# Patient Record
Sex: Male | Born: 1955 | Race: White | Hispanic: No | Marital: Married | Smoking: Former smoker
Health system: Southern US, Community
[De-identification: ages and names within clinical notes are randomized; demographics above are authoritative.]

## PROBLEM LIST (undated history)

## (undated) DIAGNOSIS — E669 Obesity, unspecified: Secondary | ICD-10-CM

## (undated) DIAGNOSIS — I10 Essential (primary) hypertension: Secondary | ICD-10-CM

## (undated) DIAGNOSIS — J45909 Unspecified asthma, uncomplicated: Secondary | ICD-10-CM

## (undated) DIAGNOSIS — M19079 Primary osteoarthritis, unspecified ankle and foot: Secondary | ICD-10-CM

## (undated) DIAGNOSIS — E559 Vitamin D deficiency, unspecified: Secondary | ICD-10-CM

## (undated) DIAGNOSIS — U071 COVID-19: Secondary | ICD-10-CM

## (undated) DIAGNOSIS — R000 Tachycardia, unspecified: Secondary | ICD-10-CM

## (undated) DIAGNOSIS — K279 Peptic ulcer, site unspecified, unspecified as acute or chronic, without hemorrhage or perforation: Secondary | ICD-10-CM

## (undated) DIAGNOSIS — M109 Gout, unspecified: Secondary | ICD-10-CM

## (undated) HISTORY — DX: Peptic ulcer, site unspecified, unspecified as acute or chronic, without hemorrhage or perforation: K27.9

## (undated) HISTORY — DX: Essential (primary) hypertension: I10

## (undated) HISTORY — DX: Obesity, unspecified: E66.9

## (undated) HISTORY — DX: Primary osteoarthritis, unspecified ankle and foot: M19.079

## (undated) HISTORY — DX: Vitamin D deficiency, unspecified: E55.9

## (undated) HISTORY — DX: Gout, unspecified: M10.9

---

## 2005-10-25 ENCOUNTER — Ambulatory Visit: Payer: Self-pay | Admitting: General Surgery

## 2008-07-04 HISTORY — PX: OTHER SURGICAL HISTORY: SHX169

## 2008-07-21 ENCOUNTER — Inpatient Hospital Stay: Payer: Self-pay | Admitting: Internal Medicine

## 2008-07-22 ENCOUNTER — Ambulatory Visit: Payer: Self-pay | Admitting: Cardiology

## 2008-07-22 LAB — HM COLONOSCOPY

## 2011-12-03 ENCOUNTER — Ambulatory Visit: Payer: Self-pay | Admitting: Family Medicine

## 2011-12-04 ENCOUNTER — Ambulatory Visit: Payer: Self-pay | Admitting: Family Medicine

## 2011-12-04 LAB — CBC WITH DIFFERENTIAL/PLATELET
Basophil #: 0 10*3/uL (ref 0.0–0.1)
Basophil %: 0.5 %
Eosinophil #: 0.1 10*3/uL (ref 0.0–0.7)
HCT: 44.2 % (ref 40.0–52.0)
HGB: 15.3 g/dL (ref 13.0–18.0)
Lymphocyte %: 32.8 %
MCHC: 34.5 g/dL (ref 32.0–36.0)
Monocyte %: 9.9 %
Neutrophil %: 55.7 %
RBC: 5 10*6/uL (ref 4.40–5.90)
RDW: 13.3 % (ref 11.5–14.5)
WBC: 7.1 10*3/uL (ref 3.8–10.6)

## 2012-11-13 ENCOUNTER — Ambulatory Visit: Payer: Self-pay

## 2013-06-26 IMAGING — CR DG FOOT COMPLETE 3+V*L*
1 series · 3 of 3 positions shown · non-contrast
Comparison: none

REASON FOR EXAM: swelling gout
COMMENTS:

[Series 1: x foot ap left · 0.14mm/px · 3 of 3 slices shown]
[im 1/3]
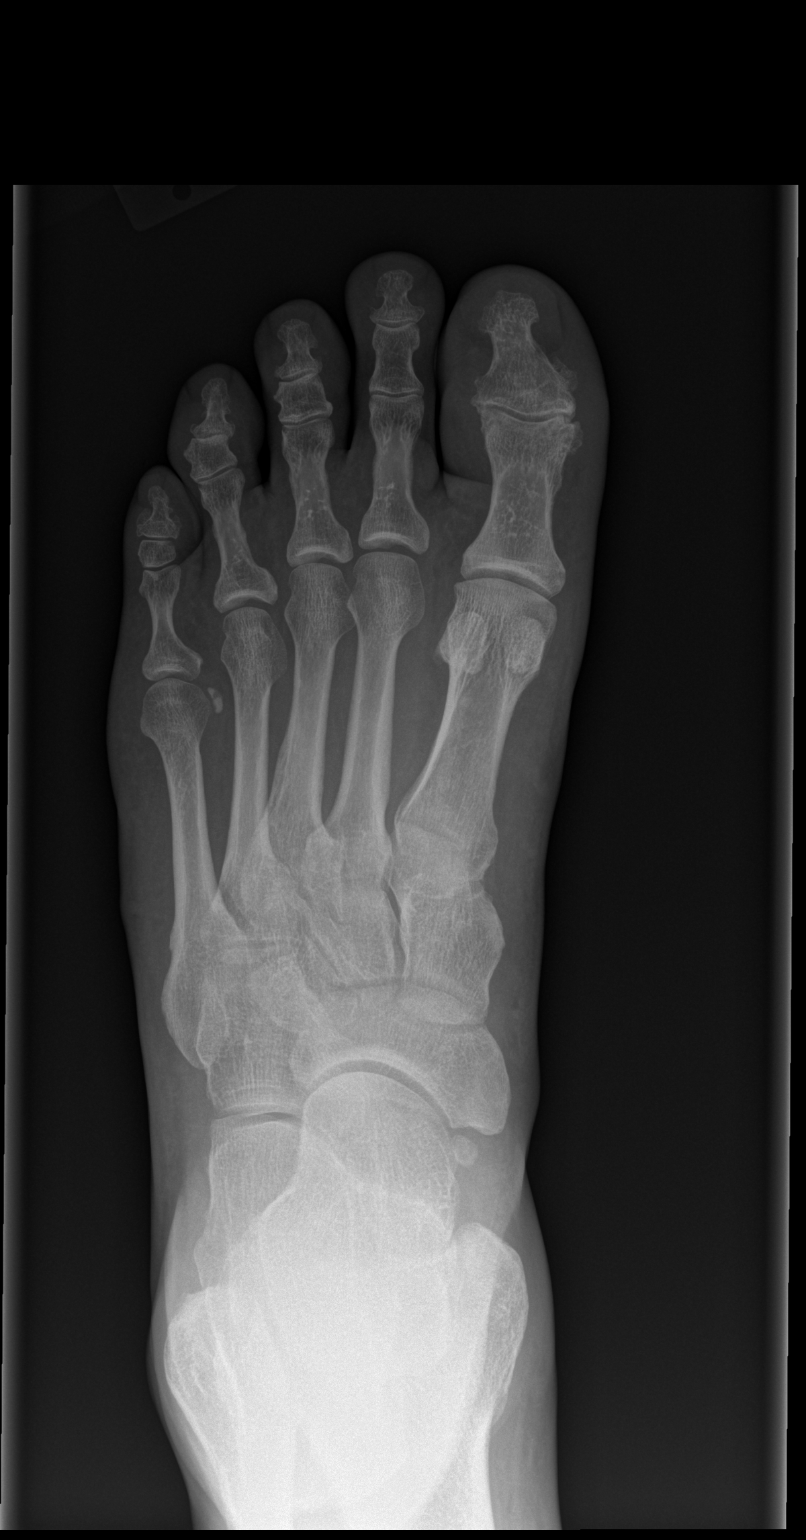
[im 2/3]
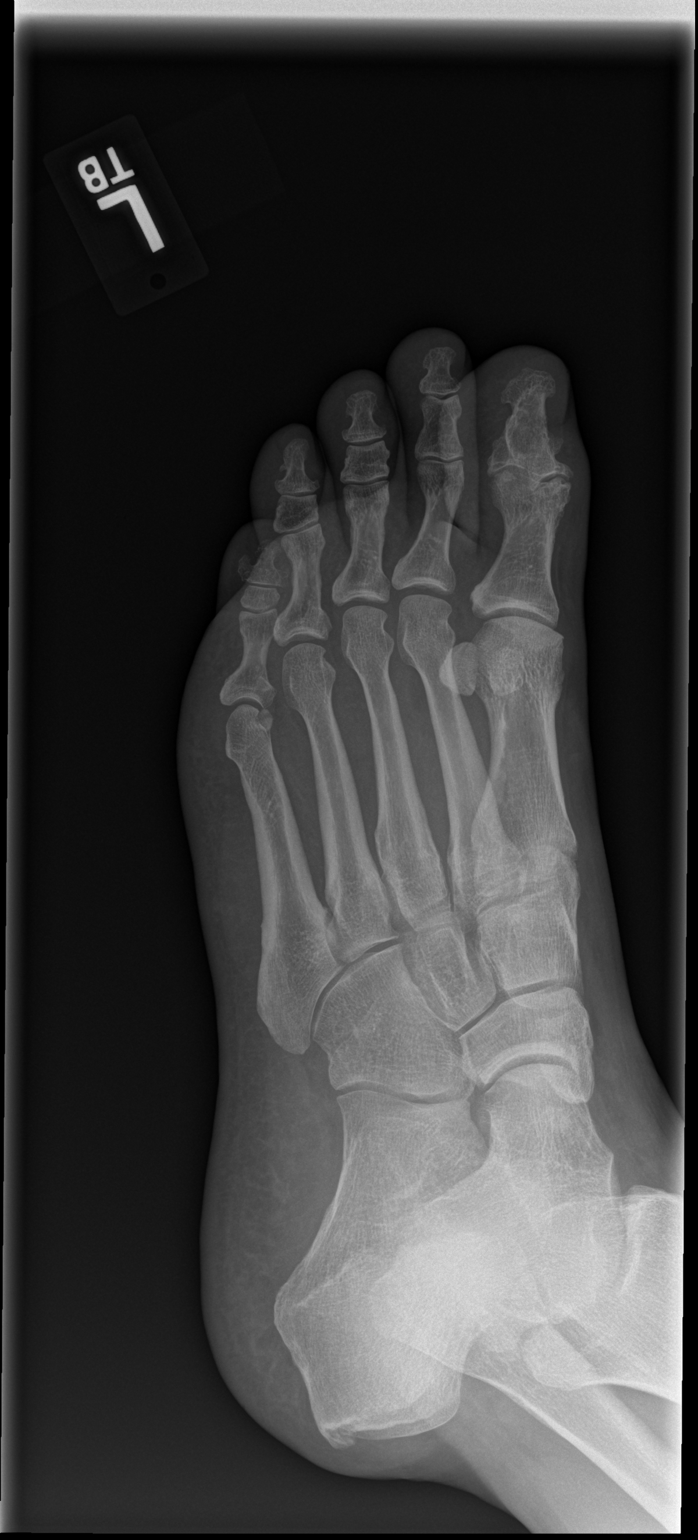
[im 3/3]
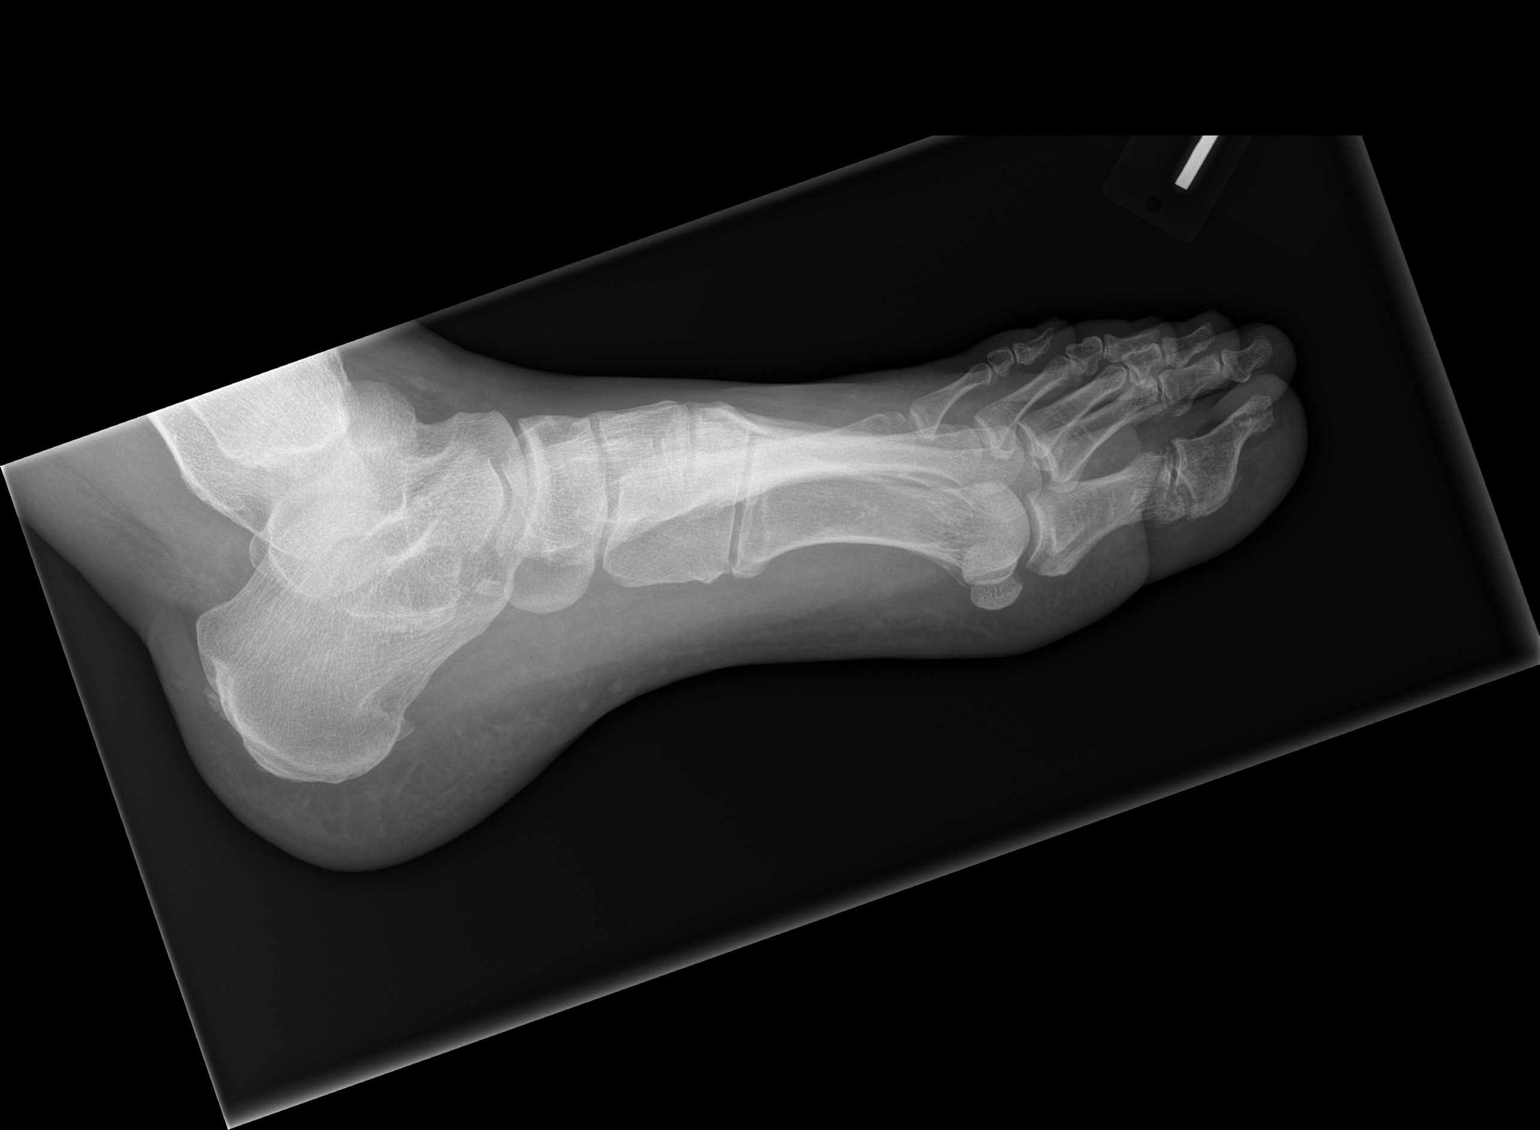

[3 of 3 positions shown; findings below may reference images not displayed]

PROCEDURE:     DXR - DXR FOOT LT COMP W/OBLIQUES  - December 04, 2011  [DATE]

RESULT:     Three views of the left foot are submitted. The bones are
reasonably well mineralized. Mild narrowing of the interphalangeal joints is
present consistent with osteoarthritis. I do not see erosive changes. There
is no juxtarticular osteopenia. No abnormal soft tissue calcifications are
identified.
IMPRESSION: 1. There are no findings to suggest gouty arthritis.
2. There are findings which likely reflect osteoarthritis involving the
interphalangeal joints.
3. Not mentioned above is a small plantar calcaneal spur.

## 2014-06-06 IMAGING — CR DG LUMBAR SPINE 2-3V
1 series · 4 of 4 positions shown · non-contrast
Comparison: none

REASON FOR EXAM: low back pain
COMMENTS:

PROCEDURE:     KDR - KDXR LUMBAR SPINE AP AND LATERAL  - November 13, 2012  [DATE]
RESULT:     History: Low back pain.
Comparison Study: No prior.

[Series 1: ap · 0.17mm/px · 4 of 4 slices shown]
[im 1/4]
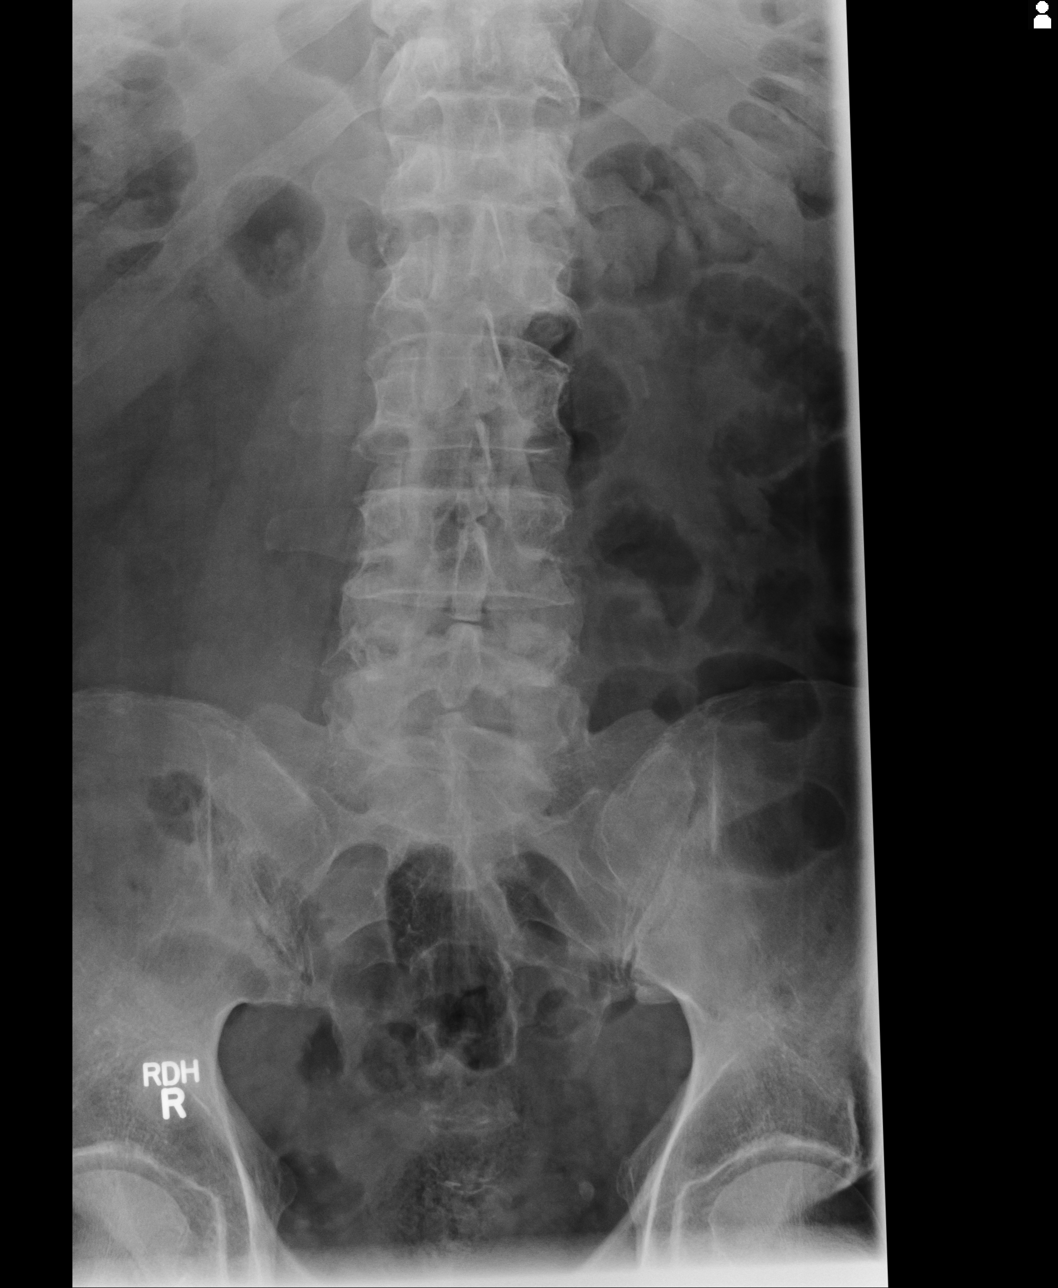
[im 2/4]
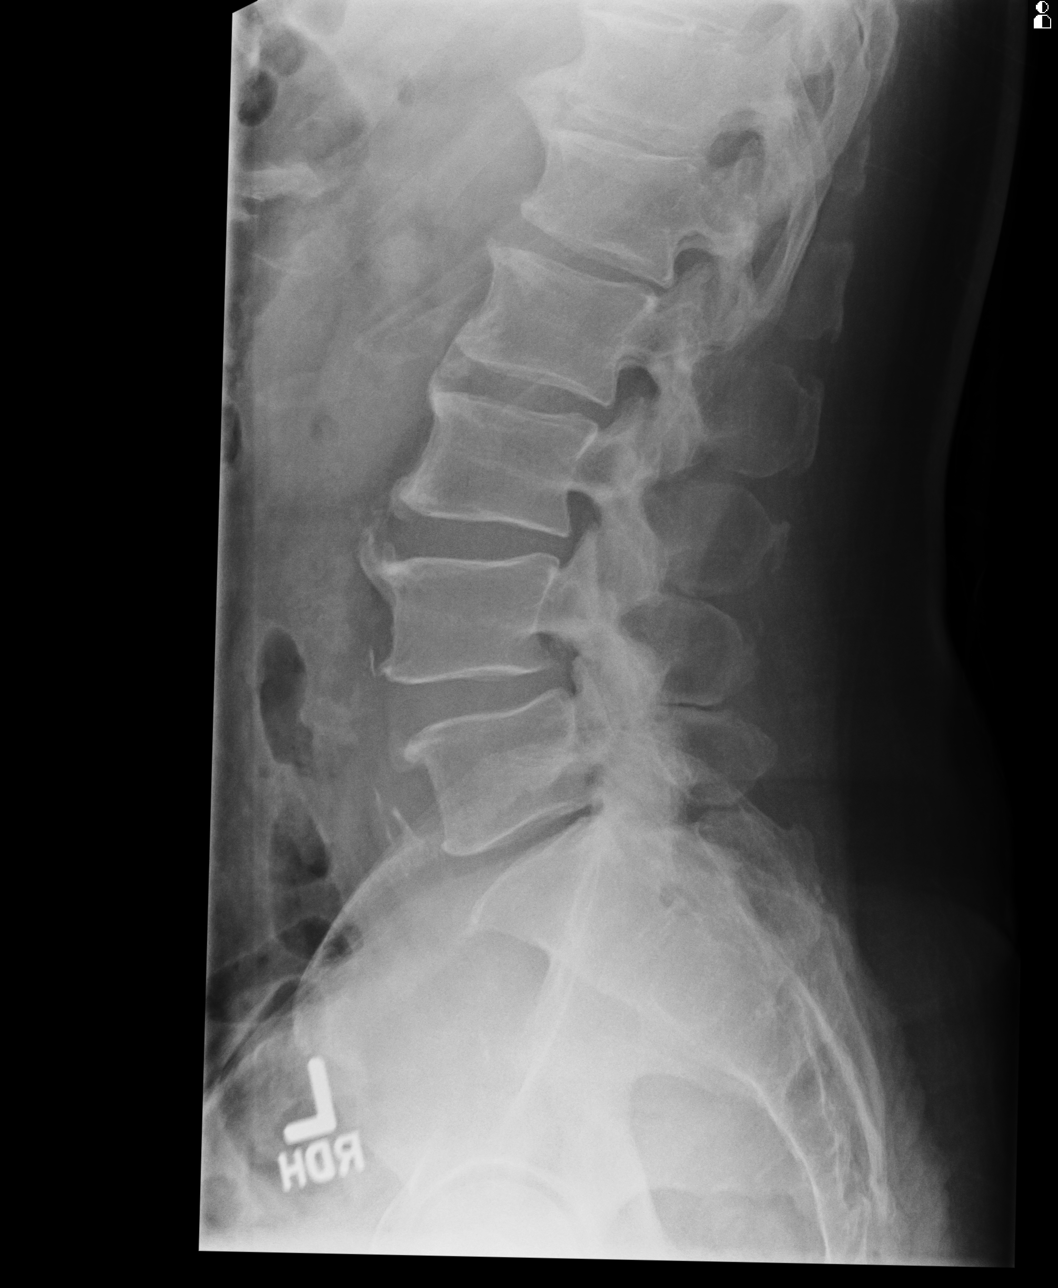
[im 3/4]
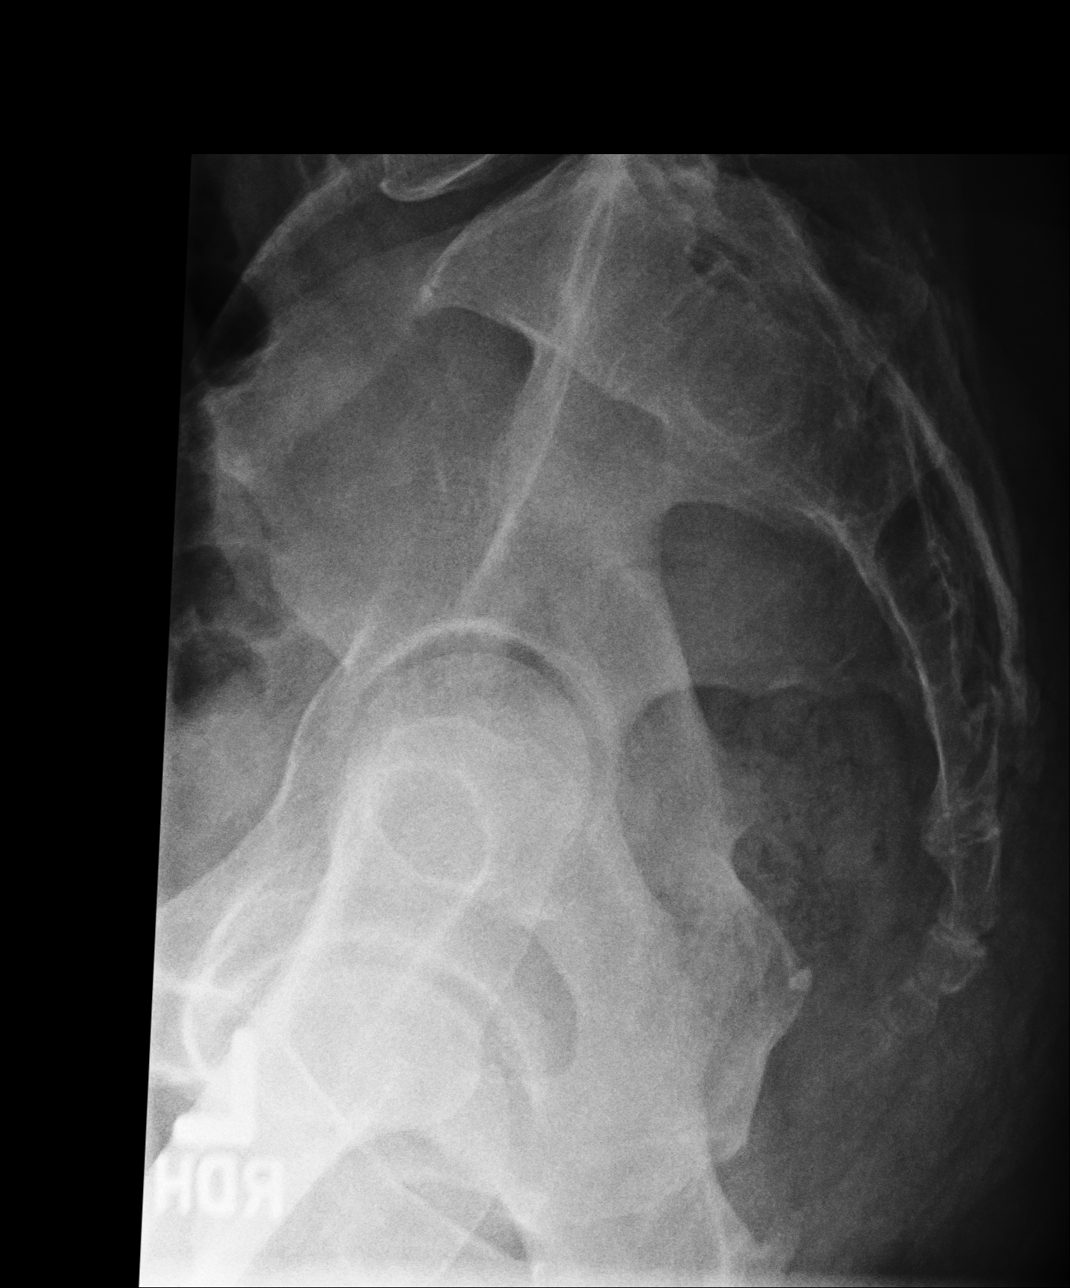
[im 4/4]
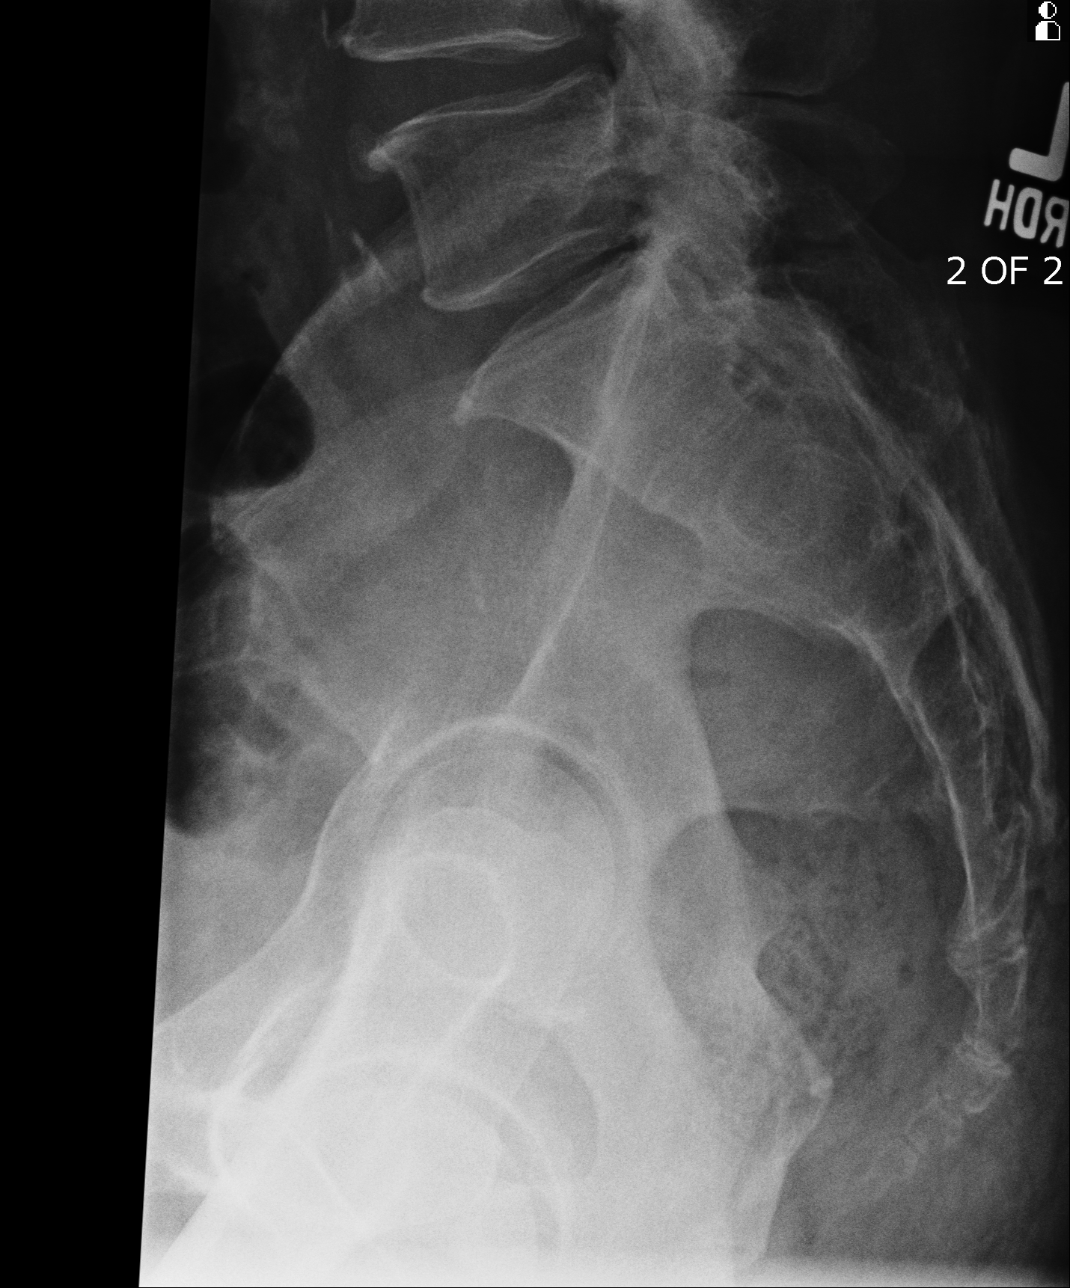

[4 of 4 positions shown; findings below may reference images not displayed]

FINDINGS: Diffuse degenerative change with prominent endplate osteophyte
formation and facet hypertrophy. No acute bony abnormality identified.
Vascular calcification noted. Degenerative changes noted SI joints and both
hips.
IMPRESSION: Diffuse degenerative change. No acute abnormality.

## 2014-07-14 LAB — LIPID PANEL
CHOLESTEROL: 179 mg/dL (ref 0–200)
HDL: 39 mg/dL (ref 35–70)
LDL Cholesterol: 103 mg/dL
LDl/HDL Ratio: 2.6
TRIGLYCERIDES: 185 mg/dL — AB (ref 40–160)

## 2014-07-14 LAB — BASIC METABOLIC PANEL
BUN: 10 mg/dL (ref 4–21)
CREATININE: 0.9 mg/dL (ref <=1.3)
GLUCOSE: 94 mg/dL
Potassium: 4.3 mmol/L (ref 3.4–5.3)
Sodium: 139 mmol/L (ref 137–147)

## 2014-07-14 LAB — TSH: TSH: 2.58 u[IU]/mL (ref <=5.90)

## 2014-07-14 LAB — HEPATIC FUNCTION PANEL
ALT: 22 U/L (ref 10–40)
AST: 15 U/L (ref 14–40)
Alkaline Phosphatase: 75 U/L (ref 25–125)
Bilirubin, Total: 0.6 mg/dL

## 2014-07-14 LAB — CBC AND DIFFERENTIAL
HCT: 46 % (ref 41–53)
Hemoglobin: 16.1 g/dL (ref 13.5–17.5)
Neutrophils Absolute: 4 /uL
Platelets: 287 10*3/uL (ref 150–399)
WBC: 6.5 10*3/mL

## 2014-07-14 LAB — PSA: PSA: 3.2

## 2014-12-06 DIAGNOSIS — I10 Essential (primary) hypertension: Secondary | ICD-10-CM

## 2014-12-06 DIAGNOSIS — M19079 Primary osteoarthritis, unspecified ankle and foot: Secondary | ICD-10-CM

## 2014-12-06 DIAGNOSIS — E7800 Pure hypercholesterolemia, unspecified: Secondary | ICD-10-CM | POA: Insufficient documentation

## 2014-12-06 DIAGNOSIS — M109 Gout, unspecified: Secondary | ICD-10-CM | POA: Insufficient documentation

## 2014-12-06 DIAGNOSIS — E559 Vitamin D deficiency, unspecified: Secondary | ICD-10-CM | POA: Insufficient documentation

## 2014-12-06 DIAGNOSIS — R5381 Other malaise: Secondary | ICD-10-CM | POA: Insufficient documentation

## 2014-12-06 DIAGNOSIS — R5383 Other fatigue: Secondary | ICD-10-CM

## 2014-12-06 DIAGNOSIS — E669 Obesity, unspecified: Secondary | ICD-10-CM | POA: Insufficient documentation

## 2014-12-06 DIAGNOSIS — G4700 Insomnia, unspecified: Secondary | ICD-10-CM | POA: Insufficient documentation

## 2014-12-06 DIAGNOSIS — K279 Peptic ulcer, site unspecified, unspecified as acute or chronic, without hemorrhage or perforation: Secondary | ICD-10-CM | POA: Insufficient documentation

## 2014-12-06 DIAGNOSIS — M5440 Lumbago with sciatica, unspecified side: Secondary | ICD-10-CM | POA: Insufficient documentation

## 2014-12-06 DIAGNOSIS — R002 Palpitations: Secondary | ICD-10-CM | POA: Insufficient documentation

## 2014-12-06 HISTORY — DX: Peptic ulcer, site unspecified, unspecified as acute or chronic, without hemorrhage or perforation: K27.9

## 2014-12-06 HISTORY — DX: Vitamin D deficiency, unspecified: E55.9

## 2014-12-06 HISTORY — DX: Primary osteoarthritis, unspecified ankle and foot: M19.079

## 2014-12-06 HISTORY — DX: Essential (primary) hypertension: I10

## 2014-12-06 HISTORY — DX: Obesity, unspecified: E66.9

## 2014-12-15 ENCOUNTER — Encounter: Payer: Self-pay | Admitting: Family Medicine

## 2014-12-15 ENCOUNTER — Ambulatory Visit (INDEPENDENT_AMBULATORY_CARE_PROVIDER_SITE_OTHER): Payer: BLUE CROSS/BLUE SHIELD | Admitting: Family Medicine

## 2014-12-15 VITALS — BP 120/72 | HR 74 | Temp 98.1°F | Resp 16 | Wt 215.0 lb

## 2014-12-15 DIAGNOSIS — I10 Essential (primary) hypertension: Secondary | ICD-10-CM | POA: Diagnosis not present

## 2014-12-15 NOTE — Progress Notes (Signed)
Patient ID: Eduardo Perez, male   DOB: 09/16/1955, 59 y.o.   MRN: [id number]    Subjective:  HPI  Hypertension, follow-up:  BP Readings from Last 3 Encounters:  12/15/14 120/72  08/09/14 142/74    He was last seen for hypertension 4 months ago.  BP at that visit was 142/74. Management changes since that visit include none, pt wanted to wait on initially, then called back later stated BP was running higher and Carvedilol was started. Marland Kitchen. He reports good compliance with treatment. He is not having side effects. He does report that he hit his hand on something and he bruised easily but has not had any since he first started it.   He is exercising. He is adherent to low salt diet.   Outside blood pressures are 120's-130's/80's. He is experiencing none.     Weight trend: stable Wt Readings from Last 3 Encounters:  12/15/14 215 lb (97.523 kg)  08/09/14 217 lb (98.431 kg)   ------------------------------------------------------------------------     Prior to Admission medications   Medication Sig Start Date End Date Taking? Authorizing Provider  carvedilol (COREG) 6.25 MG tablet Take by mouth. 10/20/14  Yes Historical Provider, MD    Patient Active Problem List   Diagnosis Date Noted  . Essential (primary) hypertension 12/06/2014  . Gout 12/06/2014  . Hypercholesteremia 12/06/2014  . Cannot sleep 12/06/2014  . Low back pain with sciatica 12/06/2014  . Malaise and fatigue 12/06/2014  . Adiposity 12/06/2014  . 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 Palpitations 12/06/2014  . Peptic ulcer 12/06/2014  . Avitaminosis D 12/06/2014    Past Medical History  Diagnosis Date  . Hypertension   . Gout     History   Social History  . Marital Status: Married    Spouse Name: N/A  . Number of Children: N/A  . Years of Education: N/A   Occupational History  . Not on file.   Social History Main Topics  . Smoking status: Former Games developermoker  . Smokeless tobacco: Not on file     Comment: former smoker; smoked for about 20 years; quit July 1996  . Alcohol Use: Yes     Comment: Occasional alcohol; usually beer, sometimes wine  . Drug Use: No  . Sexual Activity: Not on file   Other Topics Concern  . Not on file   Social History Narrative    Allergies  Allergen Reactions  . Losartan     palpitation    Review of Systems  Gastrointestinal: Negative.   Psychiatric/Behavioral: Negative.   All other systems reviewed and are negative.    There is no immunization history on file for this patient. Objective:  BP 120/72 mmHg  Pulse 74  Temp(Src) 98.1 F (36.7 C)  Resp 16  Wt 215 lb (97.523 kg)  Physical Exam  Constitutional: He is oriented to person, place, and time and well-developed, well-nourished, and in no distress.  HENT:  Head: Normocephalic and atraumatic.  Right Ear: External ear normal.  Left Ear: External ear normal.  Nose: Nose normal.  Eyes: Conjunctivae are normal.  Neck: Normal range of motion.  Cardiovascular: Normal rate, regular rhythm and normal heart sounds.   Pulmonary/Chest: Effort normal and breath sounds normal.  Abdominal: Soft. Bowel sounds are normal.  Neurological: He is oriented to person, place, and time.  Skin: Skin is warm and dry.  Psychiatric: Mood, memory, affect and judgment normal.    Lab Results  Component Value Date   WBC 6.5  07/14/2014   HGB 16.1 07/14/2014   HCT 46 07/14/2014   PLT 287 07/14/2014   CHOL 179 07/14/2014   TRIG 185* 07/14/2014   HDL 39 07/14/2014   LDLCALC 103 07/14/2014   TSH 2.58 07/14/2014   PSA 3.2 07/14/2014    CMP     Component Value Date/Time   NA 139 07/14/2014   K 4.3 07/14/2014   BUN 10 07/14/2014   CREATININE 0.9 07/14/2014   AST 15 07/14/2014   ALT 22 07/14/2014   ALKPHOS 75 07/14/2014    Assessment and Plan :  Hypertension Well-controlled. 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 I congratulated him on this. 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 Return to clinic 6 months. I have done the exam and reviewed the above chart and it is accurate to the best of my knowledge.  Julieanne Manson MD [facility] 12/15/2014 8:10 AM

## 2015-01-05 ENCOUNTER — Ambulatory Visit: Payer: Self-pay | Admitting: Family Medicine

## 2015-05-04 ENCOUNTER — Ambulatory Visit (INDEPENDENT_AMBULATORY_CARE_PROVIDER_SITE_OTHER): Payer: BLUE CROSS/BLUE SHIELD | Admitting: Family Medicine

## 2015-05-04 ENCOUNTER — Encounter: Payer: Self-pay | Admitting: Family Medicine

## 2015-05-04 VITALS — BP 146/72 | HR 84 | Temp 98.4°F | Resp 16 | Ht 71.25 in | Wt 213.0 lb

## 2015-05-04 DIAGNOSIS — N402 Nodular prostate without lower urinary tract symptoms: Secondary | ICD-10-CM

## 2015-05-04 DIAGNOSIS — Z0000 Encounter for general adult medical examination without abnormal findings: Secondary | ICD-10-CM

## 2015-05-04 DIAGNOSIS — Z125 Encounter for screening for malignant neoplasm of prostate: Secondary | ICD-10-CM

## 2015-05-04 DIAGNOSIS — Z1211 Encounter for screening for malignant neoplasm of colon: Secondary | ICD-10-CM

## 2015-05-04 LAB — IFOBT (OCCULT BLOOD): IMMUNOLOGICAL FECAL OCCULT BLOOD TEST: NEGATIVE

## 2015-05-04 LAB — POCT URINALYSIS DIPSTICK
Bilirubin, UA: NEGATIVE
GLUCOSE UA: NEGATIVE
KETONES UA: NEGATIVE
Leukocytes, UA: NEGATIVE
Nitrite, UA: NEGATIVE
Protein, UA: NEGATIVE
RBC UA: NEGATIVE
SPEC GRAV UA: 1.025
UROBILINOGEN UA: 0.2
pH, UA: 6

## 2015-05-04 MED ORDER — CARVEDILOL 6.25 MG PO TABS: 6.2500 mg | ORAL_TABLET | Freq: Two times a day (BID) | ORAL | Status: DC

## 2015-05-04 NOTE — Progress Notes (Signed)
Patient ID: Eduardo Perez, male   DOB: 02/25/1956, 59 y.o.   MRN: [id number]  Visit Date: 05/04/2015  Today's Provider: Megan Mansichard Persis Graffius Jr, MD   Chief Complaint  Patient presents with  . Annual Exam   Subjective:  Eduardo Beersaul F Diniz is a 59 y.o. male who presents today for health maintenance and complete physical. He feels well. He reports not exercising regularly but plays golf. He reports he is sleeping well. Last colonoscopy 07/22/08. Patient declines Zoster and Flu vaccine today.  Review of Systems  Constitutional: Negative.   HENT: Negative.   Eyes: Negative.   Respiratory: Negative.   Cardiovascular: Negative.   Gastrointestinal: Negative.   Endocrine: Negative.   Genitourinary: Negative.   Musculoskeletal: Negative.   Skin: Negative.   Allergic/Immunologic: Negative.   Neurological: Negative.   Hematological: Negative.   Psychiatric/Behavioral: Negative.     Social History   Social History  . Marital Status: Married    Spouse Name: N/A  . Number of Children: N/A  . Years of Education: N/A   Occupational History  . Not on file.   Social History Main Topics  . Smoking status: Former Smoker    Types: Cigars  . Smokeless tobacco: Never Used     Comment: former smoker; smoked for about 20 years; quit July 1996  . Alcohol Use: Yes     Comment: Occasional alcohol; usually beer, sometimes wine  . Drug Use: No  . Sexual Activity: Not on file   Other Topics Concern  . Not on file   Social History Narrative    Patient Active Problem List   Diagnosis Date Noted  . Essential (primary) hypertension 12/06/2014  . Gout 12/06/2014  . Hypercholesteremia 12/06/2014  . Cannot sleep 12/06/2014  . Low back pain with sciatica 12/06/2014  . Malaise and fatigue 12/06/2014  . Adiposity 12/06/2014  . 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 Palpitations 12/06/2014  . Peptic ulcer 12/06/2014  . Avitaminosis D 12/06/2014    Past Surgical History  Procedure Laterality Date   . Hx of transfusion of packed red blood cells  07/2008    His family history includes Coronary artery disease in his maternal grandfather and maternal grandmother.    Outpatient Prescriptions Prior to Visit  Medication Sig Dispense Refill  . carvedilol (COREG) 6.25 MG tablet Take by mouth.     No facility-administered medications prior to visit.    Patient Care Team: Maple Hudsonichard L Ninfa Giannelli Jr., MD as PCP - General (Family Medicine)     Objective:   Vitals:  Filed Vitals:   05/04/15 0844  BP: 146/72  Pulse: 84  Temp: 98.4 F (36.9 C)  Resp: 16  Height: 5' 11.25" (1.81 m)  Weight: 213 lb (96.616 kg)    Physical Exam  Constitutional: He is oriented to person, place, and time. He appears well-developed and well-nourished.  HENT:  Head: Normocephalic and atraumatic.  Right Ear: External ear normal.  Left Ear: External ear normal.  Nose: Nose normal.  Eyes: Conjunctivae are normal.  Neck: Neck supple.  Cardiovascular: Normal rate, regular rhythm and normal heart sounds.   Pulmonary/Chest: Effort normal and breath sounds normal.  Abdominal: Soft.  Genitourinary: Rectum normal, prostate normal and penis normal.  BB size nodule of posterior left lobe of prostate.  Musculoskeletal: Normal range of motion.  Neurological: He is alert and oriented to person, place, and time.  Skin: Skin is warm and dry.  Psychiatric: He has a normal mood and affect. His behavior  is normal. Judgment and thought content normal.     Depression Screen PHQ 2/9 Scores 05/04/2015  PHQ - 2 Score 0      Assessment & Plan:  1. Annual physical exam Tdap done 2009. - CBC with Differential/Platelet - Lipid Panel With LDL/HDL Ratio - TSH - POCT urinalysis dipstick - Comprehensive metabolic panel  2. Prostate cancer screening  - PSA  3. Colon cancer screening  - IFOBT POC (occult bld, rslt in office)  4. Prostate nodule Very small.  - Ambulatory referral to Urology

## 2015-05-06 LAB — LIPID PANEL WITH LDL/HDL RATIO
CHOLESTEROL TOTAL: 200 mg/dL — AB (ref 100–199)
HDL: 41 mg/dL (ref >39)
LDL CALC: 123 mg/dL — AB (ref 0–99)
LDl/HDL Ratio: 3 ratio units (ref 0.0–3.6)
TRIGLYCERIDES: 178 mg/dL — AB (ref 0–149)
VLDL CHOLESTEROL CAL: 36 mg/dL (ref 5–40)

## 2015-05-06 LAB — CBC WITH DIFFERENTIAL/PLATELET
Basophils Absolute: 0 10*3/uL (ref 0.0–0.2)
Basos: 1 %
EOS (ABSOLUTE): 0.1 10*3/uL (ref 0.0–0.4)
Eos: 1 %
HEMATOCRIT: 45.1 % (ref 37.5–51.0)
HEMOGLOBIN: 15.3 g/dL (ref 12.6–17.7)
Immature Grans (Abs): 0 10*3/uL (ref 0.0–0.1)
Immature Granulocytes: 0 %
Lymphocytes Absolute: 1.4 10*3/uL (ref 0.7–3.1)
Lymphs: 26 %
MCH: 30.4 pg (ref 26.6–33.0)
MCHC: 33.9 g/dL (ref 31.5–35.7)
MCV: 90 fL (ref 79–97)
MONOCYTES: 9 %
Monocytes Absolute: 0.5 10*3/uL (ref 0.1–0.9)
NEUTROS ABS: 3.5 10*3/uL (ref 1.4–7.0)
Neutrophils: 63 %
Platelets: 246 10*3/uL (ref 150–379)
RBC: 5.04 x10E6/uL (ref 4.14–5.80)
RDW: 13.7 % (ref 12.3–15.4)
WBC: 5.4 10*3/uL (ref 3.4–10.8)

## 2015-05-06 LAB — COMPREHENSIVE METABOLIC PANEL
A/G RATIO: 1.8 (ref 1.1–2.5)
ALBUMIN: 4.4 g/dL (ref 3.6–4.8)
ALT: 18 IU/L (ref 0–44)
AST: 19 IU/L (ref 0–40)
Alkaline Phosphatase: 67 IU/L (ref 39–117)
BUN / CREAT RATIO: 14 (ref 10–22)
BUN: 12 mg/dL (ref 8–27)
Bilirubin Total: 0.5 mg/dL (ref 0.0–1.2)
CO2: 25 mmol/L (ref 18–29)
Calcium: 9.5 mg/dL (ref 8.6–10.2)
Chloride: 98 mmol/L (ref 97–106)
Creatinine, Ser: 0.87 mg/dL (ref 0.76–1.27)
GFR calc non Af Amer: 94 mL/min/{1.73_m2} (ref >59)
GFR, EST AFRICAN AMERICAN: 108 mL/min/{1.73_m2} (ref >59)
GLOBULIN, TOTAL: 2.4 g/dL (ref 1.5–4.5)
Glucose: 90 mg/dL (ref 65–99)
POTASSIUM: 4.5 mmol/L (ref 3.5–5.2)
SODIUM: 139 mmol/L (ref 136–144)
TOTAL PROTEIN: 6.8 g/dL (ref 6.0–8.5)

## 2015-05-06 LAB — PSA: Prostate Specific Ag, Serum: 2.9 ng/mL (ref 0.0–4.0)

## 2015-05-06 LAB — TSH: TSH: 1.56 u[IU]/mL (ref 0.450–4.500)

## 2015-05-09 ENCOUNTER — Ambulatory Visit: Payer: Self-pay | Admitting: Urology

## 2015-05-10 ENCOUNTER — Telehealth: Payer: Self-pay | Admitting: Family Medicine

## 2015-05-10 NOTE — Telephone Encounter (Signed)
See other note, i just called patient earlier this morning-aa

## 2015-05-10 NOTE — Telephone Encounter (Signed)
Pt stated that he would like his lab results that were done on 05/05/15. I looks like they are back not sure if they have been reviewed. Thanks TNP

## 2015-05-15 ENCOUNTER — Ambulatory Visit: Payer: Self-pay | Admitting: Urology

## 2015-05-16 ENCOUNTER — Ambulatory Visit (INDEPENDENT_AMBULATORY_CARE_PROVIDER_SITE_OTHER): Payer: Self-pay | Admitting: Urology

## 2015-05-16 ENCOUNTER — Encounter: Payer: Self-pay | Admitting: Urology

## 2015-05-16 VITALS — BP 134/76 | HR 65 | Ht 71.0 in | Wt 209.0 lb

## 2015-05-16 DIAGNOSIS — N402 Nodular prostate without lower urinary tract symptoms: Secondary | ICD-10-CM | POA: Insufficient documentation

## 2015-05-16 NOTE — Progress Notes (Signed)
05/16/2015 3:39 PM   Eduardo Perez 04/26/1956 [id number]  Referring provider: Maple Hudsonichard L Gilbert Jr., MD [address] [place], [place] [address]  Chief Complaint  Patient presents with  . New Patient (Initial Visit)    Prostate nodule, BB size nodule of posterior left lobe of prostate.    HPI:  1 - Prostate Nodule - small prostate contour abnormality by annual screening astutely noted by Dr. Sullivan LoneGilbert 05/2015. No FHX prostate cancer. Recent Course: 07/2014 - PSA 3.2 05/2014 - PSA 2.9 / ? Left sided nodule by PCP, no overt abnormalities on DRE here, 40gm smooth.  PMH sig for HTN, HLD. No blood thinners. No CV disease. No prior surgeries. His PCP is Julieanne Mansonichard Gilbert MD.  Today "Eduardo Perez" is seen as new patient for above.     PMH: Past Medical History  Diagnosis Date  . Hypertension   . Gout   . Essential (primary) hypertension 12/06/2014    Intolerant of Losartan-palpitation.   . Peptic ulcer 12/06/2014    History of. No NSAIDs.   . 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 Adiposity 12/06/2014  . Avitaminosis D 12/06/2014    Surgical History: Past Surgical History  Procedure Laterality Date  . Hx of transfusion of packed red blood cells  07/2008    Home Medications:    Medication List       This list is accurate as of: 05/16/15  3:39 PM.  Always use your most recent med list.               carvedilol 6.25 MG tablet  Commonly known as:  COREG  Take 1 tablet (6.25 mg total) by mouth 2 (two) times daily with a meal.     multivitamin tablet  Take 1 tablet by mouth daily.        Allergies:  Allergies  Allergen Reactions  . Losartan     palpitation    Family History: Family History  Problem Relation Age of Onset  . Coronary artery disease Maternal Grandmother   . Coronary artery disease Maternal Grandfather   . Prostate cancer Neg Hx   . Bladder Cancer Neg Hx   . Kidney cancer Neg Hx     Social History:  reports that he has quit smoking. His  smoking use included Cigars. He has never used smokeless tobacco. He reports that he drinks alcohol. He reports that he does not use illicit drugs.  ROS: UROLOGY Frequent Urination?: Yes Hard to postpone urination?: No Burning/pain with urination?: No Get up at night to urinate?: Yes Leakage of urine?: No Urine stream starts and stops?: No Trouble starting stream?: No Do you have to strain to urinate?: No Blood in urine?: No Urinary tract infection?: No Sexually transmitted disease?: No Injury to kidneys or bladder?: No Painful intercourse?: No Weak stream?: No Erection problems?: No Penile pain?: No  Gastrointestinal Nausea?: No Vomiting?: No Indigestion/heartburn?: No Diarrhea?: No Constipation?: No  Constitutional Fever: No Night sweats?: No Weight loss?: No Fatigue?: No  Skin Skin rash/lesions?: No Itching?: No  Eyes Blurred vision?: No Double vision?: No  Ears/Nose/Throat Sore throat?: No Sinus problems?: No  Hematologic/Lymphatic Swollen glands?: No Easy bruising?: No  Cardiovascular Leg swelling?: No Chest pain?: No  Respiratory Cough?: No Shortness of breath?: No  Endocrine Excessive thirst?: No  Musculoskeletal Back pain?: No Joint pain?: No  Neurological Headaches?: No Dizziness?: No  Psychologic Depression?: No Anxiety?: No  Physical Exam: BP 134/76 mmHg  Pulse 65  Ht   (1.803 m)  Wt 94.802 kg (209 lb)  BMI 29.16 kg/m2  Constitutional:  Alert and oriented, No acute distress. HEENT: [place] AT, moist mucus membranes.  Trachea midline, no masses. Cardiovascular: No clubbing, cyanosis, or edema. Respiratory: Normal respiratory effort, no increased work of breathing. GI: Abdomen is soft, nontender, nondistended, no abdominal masses GU: No CVA tenderness. Circumcised. Phallus straight. No testes masses. DRE 40gm smooth. No nodules appreciated.  Skin: No rashes, bruises or suspicious lesions. Lymph: No cervical or inguinal  adenopathy. Neurologic: Grossly intact, no focal deficits, moving all 4 extremities. Psychiatric: Normal mood and affect.  Laboratory Data: Lab Results  Component Value Date   WBC 5.4 05/05/2015   HGB 16.1 07/14/2014   HCT 45.1 05/05/2015   MCV 89 12/04/2011   PLT 287 07/14/2014    Lab Results  Component Value Date   CREATININE 0.87 05/05/2015    Lab Results  Component Value Date   PSA 2.9 05/05/2015   PSA 3.2 07/14/2014    No results found for: TESTOSTERONE  No results found for: HGBA1C  Urinalysis    Component Value Date/Time   BILIRUBINUR negative 05/04/2015 0932   PROTEINUR negative 05/04/2015 0932   UROBILINOGEN 0.2 05/04/2015 0932   NITRITE negative 05/04/2015 0932   LEUKOCYTESUR Negative 05/04/2015 0932      Assessment & Plan:    1 - Prostate Nodule - exam today very reassuring. No frank nodules noted. ? possibility small internal hemorrhoid now resolved or other etiology felt on piror exam. Applaud Dr. Elisabeth Cara vigilance on this issue. PSA stability also very favorable. We discussed option of biopsy v. Serial exam and we both agree on the latter.    2 - RTC 81mo for exam and free/total PSA, then consider annual here v. Per PCP.   Return in about 6 months (around 11/14/2015).  Sebastian Ache, MD  [facility] [address], [address] [place], [place] [address] [phone]

## 2015-11-02 ENCOUNTER — Ambulatory Visit: Payer: Self-pay | Admitting: Family Medicine

## 2015-11-07 ENCOUNTER — Other Ambulatory Visit: Payer: Self-pay

## 2015-11-07 DIAGNOSIS — N402 Nodular prostate without lower urinary tract symptoms: Secondary | ICD-10-CM

## 2015-11-08 ENCOUNTER — Encounter: Payer: Self-pay | Admitting: Urology

## 2015-11-08 LAB — PSA, TOTAL AND FREE
PROSTATE SPECIFIC AG, SERUM: 2 ng/mL (ref 0.0–4.0)
PSA FREE PCT: 34.5 %
PSA FREE: 0.69 ng/mL

## 2015-11-13 ENCOUNTER — Ambulatory Visit (INDEPENDENT_AMBULATORY_CARE_PROVIDER_SITE_OTHER): Payer: Self-pay | Admitting: Urology

## 2015-11-13 VITALS — Ht 71.0 in | Wt 211.4 lb

## 2015-11-13 DIAGNOSIS — N402 Nodular prostate without lower urinary tract symptoms: Secondary | ICD-10-CM

## 2015-11-13 NOTE — Progress Notes (Signed)
05/16/2015 3:39 PM   Eduardo Perez Dec 07, 1955 [id number]  Referring provider: Maple Hudson., MD [address] [place], [place] [address]  Chief Complaint  Patient presents with  . New Patient (Initial Visit)    Prostate nodule, BB size nodule of posterior left lobe of prostate.    HPI:  1 - Prostate Nodule - small prostate contour abnormality by annual screening astutely noted by Dr. Sullivan Lone 05/2015. No FHX prostate cancer. Recent Course: 07/2014 - PSA 3.2 05/2014 - PSA 2.9 / ? Left sided nodule by PCP, no overt abnormalities on DRE here, 40gm smooth. 11/2015 - PSA 2.0 with 35% free / DRE remains 40gm smooth / stable very subtle contour abnormality left mid) ===> nomogram prediects <5% malignancy.   PMH sig for HTN, HLD. No blood thinners. No CV disease. No prior surgeries. His PCP is Julieanne Manson MD.  Today "Eduardo Perez" is seen in f/u above. PSA remains stbale with favorable free percent. Exam also remains stable. 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     PMH: Past Medical History  Diagnosis Date  . Hypertension   . Gout   . Essential (primary) hypertension 12/06/2014    Intolerant of Losartan-palpitation.   . Peptic ulcer 12/06/2014    History of. No NSAIDs.   . 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 Adiposity 12/06/2014  . Avitaminosis D 12/06/2014    Surgical History: Past Surgical History  Procedure Laterality Date  . Hx of transfusion of packed red blood cells  07/2008    Home Medications:    Medication List       This list is accurate as of: 05/16/15  3:39 PM.  Always use your most recent med list.               carvedilol 6.25 MG tablet  Commonly known as:  COREG  Take 1 tablet (6.25 mg total) by mouth 2 (two) times daily with a meal.     multivitamin tablet  Take 1 tablet by mouth daily.        Allergies:  Allergies  Allergen Reactions  . Losartan     palpitation    Family  History: Family History  Problem Relation Age of Onset  . Coronary artery disease Maternal Grandmother   . Coronary artery disease Maternal Grandfather   . Prostate cancer Neg Hx   . Bladder Cancer Neg Hx   . Kidney cancer Neg Hx     Social History:  reports that he has quit smoking. His smoking use included Cigars. He has never used smokeless tobacco. He reports that he drinks alcohol. He reports that he does not use illicit drugs.  ROS: UROLOGY Frequent Urination?: Yes Hard to postpone urination?: No Burning/pain with urination?: No Get up at night to urinate?: Yes Leakage of urine?: No Urine stream starts and stops?: No Trouble starting stream?: No Do you have to strain to urinate?: No Blood in urine?: No Urinary tract infection?: No Sexually transmitted disease?: No Injury to kidneys or bladder?: No Painful intercourse?: No Weak stream?: No Erection problems?: No Penile pain?: No  Gastrointestinal Nausea?: No Vomiting?: No Indigestion/heartburn?: No Diarrhea?: No Constipation?: No  Constitutional Fever: No Night sweats?: No Weight loss?: No Fatigue?: No  Skin Skin rash/lesions?: No Itching?: No  Eyes Blurred vision?: No Double vision?: No  Ears/Nose/Throat Sore throat?: No Sinus problems?: No  Hematologic/Lymphatic Swollen glands?: No Easy bruising?: No  Cardiovascular Leg  swelling?: No Chest pain?: No  Respiratory Cough?: No Shortness of breath?: No  Endocrine Excessive thirst?: No  Musculoskeletal Back pain?: No Joint pain?: No  Neurological Headaches?: No Dizziness?: No  Psychologic Depression?: No Anxiety?: No  Physical Exam: BP 134/76 mmHg  Pulse 65  Ht 5\' 11"  (1.803 m)  Wt 94.802 kg (209 lb)  BMI 29.16 kg/m2  Constitutional:  Alert and oriented, No acute distress. HEENT: [place] AT, moist mucus membranes.  Trachea midline, no masses. Cardiovascular: No clubbing, cyanosis, or edema. Respiratory: Normal respiratory effort,  no increased work of breathing. GI: Abdomen is soft, nontender, nondistended, no abdominal masses GU: No CVA tenderness. Circumcised. Phallus straight. No testes masses. DRE 40gm smooth. No nodules appreciated. Very subtle contour abnormality Lt mid that is stable.  Skin: No rashes, bruises or suspicious lesions. Lymph: No cervical or inguinal adenopathy. Neurologic: Grossly intact, no focal deficits, moving all 4 extremities. Psychiatric: Normal mood and affect.  Laboratory Data: Lab Results  Component Value Date   WBC 5.4 05/05/2015   HGB 16.1 07/14/2014   HCT 45.1 05/05/2015   MCV 89 12/04/2011   PLT 287 07/14/2014    Lab Results  Component Value Date   CREATININE 0.87 05/05/2015    Lab Results  Component Value Date   PSA 2.9 05/05/2015   PSA 3.2 07/14/2014    No results found for: TESTOSTERONE  No results found for: HGBA1C  Urinalysis    Component Value Date/Time   BILIRUBINUR negative 05/04/2015 0932   PROTEINUR negative 05/04/2015 0932   UROBILINOGEN 0.2 05/04/2015 0932   NITRITE negative 05/04/2015 0932   LEUKOCYTESUR Negative 05/04/2015 0932      Assessment & Plan:    1 - Prostate Nodule - exam today very reassuring and stable. PSA trend (ups/ downs) and high free percent also very reassuring. Very low suspicions for significant carcinoma. We both agree to revert to annual screening / PSA per PCP, consider further eval for persistent PSA rises only. Strongly commend Dr. Sullivan LoneGilbert for his thorough exam.   2 - RTC Urol prn.    Sebastian AcheMANNY, Jaquann Guarisco, MD  [facility] [address], [address] [place], [place] [address] [phone]

## 2016-05-23 ENCOUNTER — Other Ambulatory Visit: Payer: Self-pay

## 2016-05-23 MED ORDER — CARVEDILOL 6.25 MG PO TABS: 6.2500 mg | ORAL_TABLET | Freq: Two times a day (BID) | ORAL | 0 refills | Status: DC

## 2016-07-01 ENCOUNTER — Other Ambulatory Visit: Payer: Self-pay | Admitting: Family Medicine

## 2016-07-02 ENCOUNTER — Other Ambulatory Visit: Payer: Self-pay

## 2016-07-02 MED ORDER — CARVEDILOL 6.25 MG PO TABS: 6.2500 mg | ORAL_TABLET | Freq: Two times a day (BID) | ORAL | 0 refills | Status: DC

## 2016-07-04 ENCOUNTER — Ambulatory Visit (INDEPENDENT_AMBULATORY_CARE_PROVIDER_SITE_OTHER): Payer: Self-pay | Admitting: Family Medicine

## 2016-07-04 ENCOUNTER — Encounter: Payer: Self-pay | Admitting: Family Medicine

## 2016-07-04 VITALS — BP 124/78 | HR 72 | Temp 98.5°F | Resp 16 | Wt 207.0 lb

## 2016-07-04 DIAGNOSIS — I10 Essential (primary) hypertension: Secondary | ICD-10-CM

## 2016-07-04 MED ORDER — CARVEDILOL 6.25 MG PO TABS: 6.2500 mg | ORAL_TABLET | Freq: Two times a day (BID) | ORAL | 11 refills | Status: DC

## 2016-07-04 NOTE — Progress Notes (Signed)
Patient: Eduardo Perez Male    DOB: 08/07/1955   61 y.o.   MRN: [id number] Visit Date: 07/04/2016  Today's Provider: Megan Mansichard Siara Gorder Jr, MD   Chief Complaint  Patient presents with  . Hypertension   Subjective:    HPI      Hypertension, follow-up:  BP Readings from Last 3 Encounters:  07/04/16 124/78  05/16/15 134/76  05/04/15 (!) 146/72    He was last seen for hypertension 1 years ago.  BP at that visit was 146/72. Management since that visit includes none. He reports excellent compliance with treatment. He is not having side effects.  He is exercising. Is active when he plays golf. He is adherent to low salt diet.   Outside blood pressures are 110's-130's/70's. He is experiencing palpitations. Stable per pt. Patient denies chest pain, chest pressure/discomfort, claudication, dyspnea, exertional chest pressure/discomfort, fatigue, irregular heart beat, lower extremity edema, near-syncope, orthopnea and syncope.   Cardiovascular risk factors include advanced age (older than 1855 for men, 3965 for women), hypertension and male gender.      Weight trend: fluctuating a bit Wt Readings from Last 3 Encounters:  07/04/16 207 lb (93.9 kg)  11/13/15 211 lb 6.4 oz (95.9 kg)  05/16/15 209 lb (94.8 kg)    Current diet: in general, a "healthy" diet    ------------------------------------------------------------------------    Allergies  Allergen Reactions  . Losartan     palpitation     Current Outpatient Prescriptions:  .  carvedilol (COREG) 6.25 MG tablet, Take 1 tablet (6.25 mg total) by mouth 2 (two) times daily with a meal., Disp: 60 tablet, Rfl: 0 .  Multiple Vitamin (MULTIVITAMIN) tablet, Take 1 tablet by mouth daily., Disp: , Rfl:   Review of Systems  Constitutional: Negative for activity change, appetite change, chills, diaphoresis, fatigue, fever and unexpected weight change.  Respiratory: Negative for shortness of breath.   Cardiovascular: Positive for  palpitations (occasionally; stable per pt). Negative for chest pain and leg swelling.    Social History  Substance Use Topics  . Smoking status: Former Smoker    Types: Cigars  . Smokeless tobacco: Never Used     Comment: former smoker; smoked for about 20 years; quit July 1996  . Alcohol use Yes     Comment: Occasional alcohol; usually beer, sometimes wine   Objective:   BP 124/78 (BP Location: Right Arm, Patient Position: Sitting, Cuff Size: Large)   Pulse 72   Temp 98.5 F (36.9 C) (Oral)   Resp 16   Wt 207 lb (93.9 kg)   BMI 28.87 kg/m   Physical Exam  Constitutional: He appears well-developed and well-nourished.  HENT:  Head: Normocephalic.  Eyes: Conjunctivae are normal.  Neck: Normal range of motion.  Cardiovascular: Normal rate, regular rhythm and normal heart sounds.   No carotid bruit  Pulmonary/Chest: Effort normal and breath sounds normal. No respiratory distress.  Psychiatric: He has a normal mood and affect. His behavior is normal.        Assessment & Plan:     1. Essential (primary) hypertension Stable. Refills provided. Will FU in 6 months for CPE.Labs 1 week before CPE - carvedilol (COREG) 6.25 MG tablet; Take 1 tablet (6.25 mg total) by mouth 2 (two) times daily with a meal.  Dispense: 60 tablet; Refill: 11      Patient seen and examined by Julieanne Mansonichard Lenya Sterne, MD, and note scribed by Allene DillonEmily Drozdowski, CMA. I have done the exam and reviewed  the above chart and it is accurate to the best of my knowledge. Dentist has been used in this note in any air is in the dictation or transcription are unintentional.  Megan Mans, MD  [facility]

## 2016-08-25 ENCOUNTER — Encounter: Payer: Self-pay | Admitting: Family Medicine

## 2016-08-25 DIAGNOSIS — M109 Gout, unspecified: Secondary | ICD-10-CM

## 2016-08-26 ENCOUNTER — Telehealth: Payer: Self-pay

## 2016-08-26 MED ORDER — PREDNISONE 10 MG PO TABS: ORAL_TABLET | ORAL | 0 refills | Status: DC

## 2016-08-26 NOTE — Telephone Encounter (Signed)
See my chart message-aa

## 2016-08-26 NOTE — Telephone Encounter (Signed)
12 day prednisone taper sent in for acute gout flare.

## 2016-09-08 ENCOUNTER — Encounter: Payer: Self-pay | Admitting: Family Medicine

## 2016-09-09 ENCOUNTER — Other Ambulatory Visit: Payer: Self-pay | Admitting: Physician Assistant

## 2016-09-09 DIAGNOSIS — M109 Gout, unspecified: Secondary | ICD-10-CM

## 2016-09-09 NOTE — Telephone Encounter (Signed)
Please review-aa 

## 2016-09-10 NOTE — Telephone Encounter (Signed)
Pt called to see if Rx for predniSONE (DELTASONE) 10 MG tablet had been sent to [facility] for his gout. Please advise. Thanks TNP

## 2016-09-11 NOTE — Telephone Encounter (Signed)
Can do another round of prednisone but we need a uric acid level then consider colchicine vs allopurinol for persistant gout.

## 2016-09-11 NOTE — Telephone Encounter (Signed)
Pt is requesting a call back.  ZO#[phone]/[phone]

## 2016-09-12 ENCOUNTER — Ambulatory Visit (INDEPENDENT_AMBULATORY_CARE_PROVIDER_SITE_OTHER): Payer: Self-pay | Admitting: Family Medicine

## 2016-09-12 ENCOUNTER — Encounter: Payer: Self-pay | Admitting: Family Medicine

## 2016-09-12 VITALS — BP 122/74 | HR 66 | Temp 98.7°F | Resp 16 | Wt 204.0 lb

## 2016-09-12 DIAGNOSIS — M109 Gout, unspecified: Secondary | ICD-10-CM

## 2016-09-12 MED ORDER — PREDNISONE 20 MG PO TABS: ORAL_TABLET | ORAL | 1 refills | Status: DC

## 2016-09-12 NOTE — Telephone Encounter (Signed)
Dr Sullivan Lone do you want patient to come in for labs and office visit or just to order labs on him? Do you want to check the level after he is done with prednisone?-aa

## 2016-09-12 NOTE — Progress Notes (Signed)
Subjective:     Patient ID: Eduardo Perez, male   DOB: [birth year], 61 y.o.   MRN: [id number]  HPI  Chief Complaint  Patient presents with  . Foot Pain    Patient comes into office today with concerns of a possible gout attack that has been occuring for the past 30days. Patient reports redness and swelling of foot, he states that he was prescribed a round of prednisone last month to help treat and reports that after finishing symptoms had completley resolved, day after finishing steroid patient states that he went out to eat and had burgers. Patient recalls that after eating food the following day pain, and swelling returned.   Attributes his recurrent flare to eating a hamburger. Wishes to get on prednisone again. Reports that usually it has been his left great toe involved but this episode has been in the right toe onset 4/7. He has had access to information regarding gout triggers and generally avoids those food and alcohol which he seldom uses.   Review of Systems     Objective:   Physical Exam  Constitutional: He appears well-developed and well-nourished. No distress.  Musculoskeletal:  Right first MTP joint swollen, warm and moderately tender       Assessment:    1. 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

## 2016-09-12 NOTE — Patient Instructions (Signed)
Please get labs when gout flares down. We will call you with the lab results.

## 2016-09-19 ENCOUNTER — Other Ambulatory Visit: Payer: Self-pay | Admitting: Family Medicine

## 2016-09-19 ENCOUNTER — Telehealth: Payer: Self-pay

## 2016-09-19 LAB — RENAL FUNCTION PANEL
Albumin: 4.2 g/dL (ref 3.6–4.8)
BUN / CREAT RATIO: 13 (ref 10–24)
BUN: 11 mg/dL (ref 8–27)
CALCIUM: 9.5 mg/dL (ref 8.6–10.2)
CO2: 27 mmol/L (ref 18–29)
CREATININE: 0.85 mg/dL (ref 0.76–1.27)
Chloride: 96 mmol/L (ref 96–106)
GFR calc non Af Amer: 93 mL/min/{1.73_m2} (ref >59)
GFR, EST AFRICAN AMERICAN: 108 mL/min/{1.73_m2} (ref >59)
GLUCOSE: 89 mg/dL (ref 65–99)
PHOSPHORUS: 3.5 mg/dL (ref 2.5–4.5)
Potassium: 4.8 mmol/L (ref 3.5–5.2)
Sodium: 140 mmol/L (ref 134–144)

## 2016-09-19 LAB — URIC ACID: Uric Acid: 6.9 mg/dL (ref 3.7–8.6)

## 2016-09-19 MED ORDER — ALLOPURINOL 100 MG PO TABS: 100.0000 mg | ORAL_TABLET | Freq: Every day | ORAL | 0 refills | Status: DC

## 2016-09-19 NOTE — Telephone Encounter (Signed)
Allopurinol sent in. Call for lab slip in 3-4 weeks before medication runs out.

## 2016-09-19 NOTE — Telephone Encounter (Signed)
Patient was advised he would like to proceed with medication and have it sent to [facility] in [place]. KW

## 2016-09-19 NOTE — Telephone Encounter (Signed)
-----   Message from Anola Gurney, [place] sent at 09/19/2016 12:35 PM EDT ----- Uric acid level 6.9. To get below 6.0 may require daily medication. Does he wish to proceed?

## 2016-09-20 NOTE — Telephone Encounter (Signed)
Patient has been advised. KW 

## 2016-10-06 ENCOUNTER — Encounter: Payer: Self-pay | Admitting: Family Medicine

## 2016-10-08 ENCOUNTER — Telehealth: Payer: Self-pay

## 2016-10-08 DIAGNOSIS — M10071 Idiopathic gout, right ankle and foot: Secondary | ICD-10-CM

## 2016-10-08 NOTE — Telephone Encounter (Signed)
(  He may mean May instead of March dates.) Recommend he continue the Allopurinol 100 mg qd until blood level rechecked as Nadine CountsBob recommended near 10-20-16.

## 2016-10-22 ENCOUNTER — Encounter: Payer: Self-pay | Admitting: Family Medicine

## 2016-10-22 ENCOUNTER — Other Ambulatory Visit: Payer: Self-pay | Admitting: Family Medicine

## 2016-10-22 MED ORDER — ALLOPURINOL 100 MG PO TABS: 100.0000 mg | ORAL_TABLET | Freq: Every day | ORAL | 0 refills | Status: DC

## 2016-12-30 ENCOUNTER — Other Ambulatory Visit: Payer: Self-pay | Admitting: Family Medicine

## 2016-12-30 NOTE — Telephone Encounter (Signed)
Pt states his gout has been acting up  [facility]urinol 100mg   [facility]  thanks Fortune Brandsteri

## 2016-12-31 MED ORDER — ALLOPURINOL 100 MG PO TABS: 100.0000 mg | ORAL_TABLET | Freq: Every day | ORAL | 11 refills | Status: DC

## 2017-01-02 ENCOUNTER — Encounter: Payer: Self-pay | Admitting: Family Medicine

## 2017-01-09 NOTE — Telephone Encounter (Signed)
Encounter opened by error. KW 

## 2017-01-23 ENCOUNTER — Ambulatory Visit (INDEPENDENT_AMBULATORY_CARE_PROVIDER_SITE_OTHER): Payer: Self-pay | Admitting: Family Medicine

## 2017-01-23 ENCOUNTER — Encounter: Payer: Self-pay | Admitting: Family Medicine

## 2017-01-23 VITALS — BP 116/78 | HR 60 | Temp 98.1°F | Resp 16 | Ht 71.0 in | Wt 194.0 lb

## 2017-01-23 DIAGNOSIS — Z0000 Encounter for general adult medical examination without abnormal findings: Secondary | ICD-10-CM

## 2017-01-23 DIAGNOSIS — Z1211 Encounter for screening for malignant neoplasm of colon: Secondary | ICD-10-CM

## 2017-01-23 DIAGNOSIS — I10 Essential (primary) hypertension: Secondary | ICD-10-CM

## 2017-01-23 DIAGNOSIS — Z125 Encounter for screening for malignant neoplasm of prostate: Secondary | ICD-10-CM

## 2017-01-23 DIAGNOSIS — M109 Gout, unspecified: Secondary | ICD-10-CM

## 2017-01-23 LAB — IFOBT (OCCULT BLOOD): IFOBT: NEGATIVE

## 2017-01-23 MED ORDER — PREDNISONE 10 MG PO TABS: ORAL_TABLET | ORAL | 0 refills | Status: DC

## 2017-01-23 NOTE — Progress Notes (Signed)
Patient: Eduardo Perez, Male    DOB: 04/16/1956, 61 y.o.   MRN: [id number] Visit Date: 01/23/2017  Today's Provider: Megan Mans, MD   Chief Complaint  Patient presents with  . Annual Exam   Subjective:    Annual physical exam Eduardo Perez is a 61 y.o. male who presents today for health maintenance and complete physical. He feels well. He reports exercising 2-4 times a week. Plays golf and works outside. He reports he is sleeping well.  ----------------------------------------------------------------- Colonoscopy- 07/22/08 Dr. Servando Snare internal hemorrhoids   Review of Systems  Constitutional: Negative.   HENT: Negative.   Eyes: Negative.   Respiratory: Negative.   Cardiovascular: Positive for palpitations (nothing new for pt).  Gastrointestinal: Negative.   Endocrine: Negative.   Genitourinary: Negative.   Musculoskeletal: Positive for joint swelling (right great toe. has had gout in it and is still swollen).  Skin: Negative.   Allergic/Immunologic: Negative.   Neurological: Negative.   Hematological: Negative.   Psychiatric/Behavioral: Negative.     Social History      He  reports that he has quit smoking. His smoking use included Cigars. He has never used smokeless tobacco. He reports that he drinks alcohol. He reports that he does not use drugs.       Social History   Social History  . Marital status: Married    Spouse name: N/A  . Number of children: N/A  . Years of education: N/A   Social History Main Topics  . Smoking status: Former Smoker    Types: Cigars  . Smokeless tobacco: Never Used     Comment: former smoker; smoked for about 20 years; quit July 1996  . Alcohol use Yes     Comment: Occasional alcohol; usually beer, sometimes wine  . Drug use: No  . Sexual activity: Not Asked   Other Topics Concern  . None   Social History Narrative  . None    Past Medical History:  Diagnosis Date  . Adiposity 12/06/2014  . Avitaminosis D 12/06/2014    . Essential (primary) hypertension 12/06/2014   Intolerant of Losartan-palpitation.   . Gout   . Hypertension   . 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 Peptic ulcer 12/06/2014   History of. No NSAIDs.      Patient Active Problem List   Diagnosis Date Noted  . Prostate nodule 05/16/2015  . Essential (primary) hypertension 12/06/2014  . Gout 12/06/2014  . Hypercholesteremia 12/06/2014  . Cannot sleep 12/06/2014  . Low back pain with sciatica 12/06/2014  . Malaise and fatigue 12/06/2014  . Adiposity 12/06/2014  . 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 Palpitations 12/06/2014  . Peptic ulcer 12/06/2014  . Avitaminosis D 12/06/2014    Past Surgical History:  Procedure Laterality Date  . Hx of transfusion of packed red blood cells  07/2008    Family History        Family Status  Relation Status  . Mother Alive  . Father Alive  . Sister Alive  . Brother Alive  . MGM Alive  . MGF Alive  . Neg Hx (Not Specified)        His family history includes Coronary artery disease in his maternal grandfather and maternal grandmother.     Allergies  Allergen Reactions  . Losartan     palpitation     Current Outpatient Prescriptions:  .  allopurinol (ZYLOPRIM) 100 MG tablet, Take 1 tablet (100 mg total) by  mouth daily., Disp: 30 tablet, Rfl: 11 .  carvedilol (COREG) 6.25 MG tablet, Take 1 tablet (6.25 mg total) by mouth 2 (two) times daily with a meal., Disp: 60 tablet, Rfl: 11 .  Multiple Vitamin (MULTIVITAMIN) tablet, Take 1 tablet by mouth daily., Disp: , Rfl:  .  predniSONE (DELTASONE) 20 MG tablet, One pill twice daily for 5 days (Patient not taking: Reported on 01/23/2017), Disp: 10 tablet, Rfl: 1   Patient Care Team: Maple Hudson., MD as PCP - General (Family Medicine)      Objective:   Vitals: BP 116/78 (BP Location: Left Arm, Patient Position: Sitting, Cuff Size: Normal)   Pulse 60   Temp 98.1 F (36.7 C) (Oral)   Resp 16   Ht 5\' 11"  (1.803 m)    Wt 194 lb (88 kg)   BMI 27.06 kg/m    Vitals:   01/23/17 1039  BP: 116/78  Pulse: 60  Resp: 16  Temp: 98.1 F (36.7 C)  TempSrc: Oral  Weight: 194 lb (88 kg)  Height: 5\' 11"  (1.803 m)     Physical Exam  Constitutional: He is oriented to person, place, and time. He appears well-developed and well-nourished.  HENT:  Head: Normocephalic and atraumatic.  Right Ear: External ear normal.  Left Ear: External ear normal.  Nose: Nose normal.  Mouth/Throat: Oropharynx is clear and moist.  Eyes: Pupils are equal, round, and reactive to light. Conjunctivae and EOM are normal.  Neck: Normal range of motion. Neck supple.  Cardiovascular: Normal rate, regular rhythm, normal heart sounds and intact distal pulses.   Pulmonary/Chest: Effort normal and breath sounds normal.  Abdominal: Soft. Bowel sounds are normal.  Genitourinary: Rectum normal, prostate normal and penis normal.  Musculoskeletal: Normal range of motion.  Neurological: He is alert and oriented to person, place, and time. He has normal reflexes.  Skin: Skin is warm and dry.  Psychiatric: He has a normal mood and affect. His behavior is normal. Judgment and thought content normal.     Depression Screen PHQ 2/9 Scores 01/23/2017 05/04/2015  PHQ - 2 Score 0 0  PHQ- 9 Score 2 -      Assessment & Plan:     Routine Health Maintenance and Physical Exam  Exercise Activities and Dietary recommendations Goals    None      Immunization History  Administered Date(s) Administered  . Tdap 06/01/2008    Health Maintenance  Topic Date Due  . HIV Screening  09/04/1969  . INFLUENZA VACCINE  01/01/2017  . TETANUS/TDAP  06/01/2018  . COLONOSCOPY  07/22/2018  . Hepatitis C Screening  Completed    Overall good health. Discussed health benefits of physical activity, and encouraged him to engage in regular exercise appropriate for his age and condition.  Mild recurrent Gout Check uric acid.     -------------------------------------------------------------------- I have done the exam and reviewed the chart and it is accurate to the best of my knowledge. Dentist has been used and  any errors in dictation or transcription are unintentional. Julieanne Manson M.D. [facility]    Megan Mans, MD  [facility]

## 2017-02-07 LAB — CBC WITH DIFFERENTIAL/PLATELET
BASOS: 0 %
Basophils Absolute: 0 10*3/uL (ref 0.0–0.2)
EOS (ABSOLUTE): 0.1 10*3/uL (ref 0.0–0.4)
EOS: 2 %
Hematocrit: 43.2 % (ref 37.5–51.0)
Hemoglobin: 14.4 g/dL (ref 13.0–17.7)
IMMATURE GRANS (ABS): 0 10*3/uL (ref 0.0–0.1)
IMMATURE GRANULOCYTES: 0 %
Lymphocytes Absolute: 1.6 10*3/uL (ref 0.7–3.1)
Lymphs: 25 %
MCH: 30.1 pg (ref 26.6–33.0)
MCHC: 33.3 g/dL (ref 31.5–35.7)
MCV: 90 fL (ref 79–97)
MONOS ABS: 0.5 10*3/uL (ref 0.1–0.9)
Monocytes: 8 %
NEUTROS PCT: 65 %
Neutrophils Absolute: 4.1 10*3/uL (ref 1.4–7.0)
Platelets: 261 10*3/uL (ref 150–379)
RBC: 4.78 x10E6/uL (ref 4.14–5.80)
RDW: 14.1 % (ref 12.3–15.4)
WBC: 6.4 10*3/uL (ref 3.4–10.8)

## 2017-02-07 LAB — COMPREHENSIVE METABOLIC PANEL
A/G RATIO: 1.8 (ref 1.2–2.2)
ALK PHOS: 71 IU/L (ref 39–117)
ALT: 17 IU/L (ref 0–44)
AST: 21 IU/L (ref 0–40)
Albumin: 4.2 g/dL (ref 3.6–4.8)
BUN/Creatinine Ratio: 11 (ref 10–24)
BUN: 10 mg/dL (ref 8–27)
Bilirubin Total: 0.6 mg/dL (ref 0.0–1.2)
CALCIUM: 9.3 mg/dL (ref 8.6–10.2)
CHLORIDE: 97 mmol/L (ref 96–106)
CO2: 26 mmol/L (ref 20–29)
Creatinine, Ser: 0.89 mg/dL (ref 0.76–1.27)
GFR calc Af Amer: 106 mL/min/{1.73_m2} (ref >59)
GFR calc non Af Amer: 92 mL/min/{1.73_m2} (ref >59)
GLOBULIN, TOTAL: 2.4 g/dL (ref 1.5–4.5)
Glucose: 96 mg/dL (ref 65–99)
POTASSIUM: 4.2 mmol/L (ref 3.5–5.2)
SODIUM: 136 mmol/L (ref 134–144)
Total Protein: 6.6 g/dL (ref 6.0–8.5)

## 2017-02-07 LAB — TSH: TSH: 2.36 u[IU]/mL (ref 0.450–4.500)

## 2017-02-07 LAB — LIPID PANEL WITH LDL/HDL RATIO
Cholesterol, Total: 187 mg/dL (ref 100–199)
HDL: 54 mg/dL (ref >39)
LDL CALC: 111 mg/dL — AB (ref 0–99)
LDl/HDL Ratio: 2.1 ratio (ref 0.0–3.6)
Triglycerides: 109 mg/dL (ref 0–149)
VLDL CHOLESTEROL CAL: 22 mg/dL (ref 5–40)

## 2017-02-07 LAB — URIC ACID: URIC ACID: 6.7 mg/dL (ref 3.7–8.6)

## 2017-02-07 LAB — PSA: Prostate Specific Ag, Serum: 2.6 ng/mL (ref 0.0–4.0)

## 2017-02-11 ENCOUNTER — Telehealth: Payer: Self-pay

## 2017-02-11 MED ORDER — ALLOPURINOL 300 MG PO TABS: 300.0000 mg | ORAL_TABLET | Freq: Every day | ORAL | 0 refills | Status: DC

## 2017-02-11 NOTE — Telephone Encounter (Signed)
-----   Message from Maple Hudsonichard L Gilbert Jr., MD sent at 02/10/2017  8:26 AM EDT ----- Labs okay. When increased allopurinol from 100-300 mg daily and recheck in a couple of months

## 2017-02-11 NOTE — Telephone Encounter (Signed)
Patient advised as below. RX updated in the chart but not sent in yet. Patient just received a 3 months supply of 100 mg tablets and he will take 3 tablets to make up the new dose. He will let us know when he needs a refill-Venezia Sargeant V Marvina Danner, RMA

## 2017-08-11 ENCOUNTER — Other Ambulatory Visit: Payer: Self-pay | Admitting: Family Medicine

## 2017-08-11 DIAGNOSIS — I10 Essential (primary) hypertension: Secondary | ICD-10-CM

## 2017-08-13 ENCOUNTER — Telehealth: Payer: Self-pay | Admitting: Family Medicine

## 2017-08-13 DIAGNOSIS — I10 Essential (primary) hypertension: Secondary | ICD-10-CM

## 2017-08-13 MED ORDER — CARVEDILOL 6.25 MG PO TABS: 6.2500 mg | ORAL_TABLET | Freq: Two times a day (BID) | ORAL | 0 refills | Status: DC

## 2017-08-13 NOTE — Telephone Encounter (Signed)
Sent!

## 2017-08-13 NOTE — Telephone Encounter (Signed)
[facility] request for following medications: carvedilol (COREG) 6.25 MG tablet     Please advise,Thanks Bowie

## 2018-02-05 ENCOUNTER — Ambulatory Visit (INDEPENDENT_AMBULATORY_CARE_PROVIDER_SITE_OTHER): Payer: Self-pay | Admitting: Family Medicine

## 2018-02-05 ENCOUNTER — Other Ambulatory Visit: Payer: Self-pay

## 2018-02-05 VITALS — BP 168/80 | HR 75 | Temp 98.7°F | Resp 16 | Ht 71.0 in | Wt 200.0 lb

## 2018-02-05 DIAGNOSIS — I10 Essential (primary) hypertension: Secondary | ICD-10-CM

## 2018-02-05 DIAGNOSIS — K279 Peptic ulcer, site unspecified, unspecified as acute or chronic, without hemorrhage or perforation: Secondary | ICD-10-CM

## 2018-02-05 DIAGNOSIS — Z1211 Encounter for screening for malignant neoplasm of colon: Secondary | ICD-10-CM

## 2018-02-05 DIAGNOSIS — Z125 Encounter for screening for malignant neoplasm of prostate: Secondary | ICD-10-CM

## 2018-02-05 DIAGNOSIS — Z0000 Encounter for general adult medical examination without abnormal findings: Secondary | ICD-10-CM

## 2018-02-05 LAB — POCT URINALYSIS DIPSTICK
BILIRUBIN UA: NEGATIVE
Glucose, UA: NEGATIVE
Ketones, UA: NEGATIVE
Leukocytes, UA: NEGATIVE
Nitrite, UA: NEGATIVE
Protein, UA: NEGATIVE
RBC UA: NEGATIVE
Spec Grav, UA: <=1.005 — AB (ref 1.010–1.025)
Urobilinogen, UA: 0.2 E.U./dL
pH, UA: 6.5 (ref 5.0–8.0)

## 2018-02-05 NOTE — Progress Notes (Signed)
Ref to gastro Patient: Eduardo Perez, Male    DOB: [birth year], 62 y.o.   MRN: [id number] Visit Date: 02/05/2018  Today's Provider: Megan Mans, MD   Chief Complaint  Patient presents with  . Annual Exam   Subjective:  Eduardo Perez is a 62 y.o. male who presents today for health maintenance and complete physical. He feels well. He reports exercising none. He reports he is sleeping well.  07/22/08 Colonoscopy, Wohl-hemorrhoids  Review of Systems  Constitutional: Negative.   HENT: Negative.   Eyes: Negative.   Respiratory: Negative.   Cardiovascular: Positive for palpitations.  Gastrointestinal: Negative.   Endocrine: Negative.   Genitourinary: Negative.   Musculoskeletal: Negative.   Skin: Negative.   Allergic/Immunologic: Negative.   Neurological: Negative.   Hematological: Negative.   Psychiatric/Behavioral: Negative.     Social History   Socioeconomic History  . Marital status: Married    Spouse name: Not on file  . Number of children: Not on file  . Years of education: Not on file  . Highest education level: Not on file  Occupational History  . Not on file  Social Needs  . Financial resource strain: Not on file  . Food insecurity:    Worry: Not on file    Inability: Not on file  . Transportation needs:    Medical: Not on file    Non-medical: Not on file  Tobacco Use  . Smoking status: Former Smoker    Types: Cigars  . Smokeless tobacco: Never Used  . Tobacco comment: former smoker; smoked for about 20 years; quit July 1996  Substance and Sexual Activity  . Alcohol use: Yes    Comment: Occasional alcohol; usually beer, sometimes wine  . Drug use: No  . Sexual activity: Not on file  Lifestyle  . Physical activity:    Days per week: Not on file    Minutes per session: Not on file  . Stress: Not on file  Relationships  . Social connections:    Talks on phone: Not on file    Gets together: Not on file    Attends religious service: Not on file     Active member of club or organization: Not on file    Attends meetings of clubs or organizations: Not on file    Relationship status: Not on file  . Intimate partner violence:    Fear of current or ex partner: Not on file    Emotionally abused: Not on file    Physically abused: Not on file    Forced sexual activity: Not on file  Other Topics Concern  . Not on file  Social History Narrative  . Not on file    Patient Active Problem List   Diagnosis Date Noted  . Prostate nodule 05/16/2015  . Essential (primary) hypertension 12/06/2014  . Gout 12/06/2014  . Hypercholesteremia 12/06/2014  . Cannot sleep 12/06/2014  . Low back pain with sciatica 12/06/2014  . Malaise and fatigue 12/06/2014  . Adiposity 12/06/2014  . 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 Palpitations 12/06/2014  . Peptic ulcer 12/06/2014  . Avitaminosis D 12/06/2014    Past Surgical History:  Procedure Laterality Date  . Hx of transfusion of packed red blood cells  07/2008    His family history includes Coronary artery disease in his maternal grandfather and maternal grandmother.     Outpatient Encounter Medications as of 02/05/2018  Medication Sig  . allopurinol (ZYLOPRIM) 300 MG tablet Take 1 tablet (300  mg total) by mouth daily.  . carvedilol (COREG) 6.25 MG tablet Take 1 tablet (6.25 mg total) by mouth 2 (two) times daily with a meal.  . Multiple Vitamin (MULTIVITAMIN) tablet Take 1 tablet by mouth daily.  . [DISCONTINUED] predniSONE (DELTASONE) 10 MG tablet As directed. Take 6 on 1st day then decreased by one daily until finished.   No facility-administered encounter medications on file as of 02/05/2018.     Patient Care Team: Maple Hudson., MD as PCP - General (Family Medicine)      Objective:   Vitals:  Vitals:   02/05/18 0844  BP: (!) 168/80  Pulse: 75  Resp: 16  Temp: 98.7 F (37.1 C)  TempSrc: Oral  Weight: 200 lb (90.7 kg)  Height: 5\' 11"  (1.803 m)    Physical Exam   Constitutional: He is oriented to person, place, and time. He appears well-developed and well-nourished.  HENT:  Head: Normocephalic and atraumatic.  Right Ear: External ear normal.  Left Ear: External ear normal.  Nose: Nose normal.  Mouth/Throat: Oropharynx is clear and moist.  Eyes: Pupils are equal, round, and reactive to light. Conjunctivae and EOM are normal.  Neck: Normal range of motion. Neck supple.  Cardiovascular: Normal rate, regular rhythm, normal heart sounds and intact distal pulses.  Pulmonary/Chest: Effort normal and breath sounds normal.  Abdominal: Soft. Bowel sounds are normal.  Genitourinary: Rectum normal, prostate normal and penis normal.  Musculoskeletal: Normal range of motion.  Neurological: He is alert and oriented to person, place, and time.  Skin: Skin is warm and dry.  Psychiatric: He has a normal mood and affect. His behavior is normal. Judgment and thought content normal.     Depression Screen PHQ 2/9 Scores 02/05/2018 01/23/2017 05/04/2015  PHQ - 2 Score 0 0 0  PHQ- 9 Score - 2 -      Assessment & Plan:     Routine Health Maintenance and Physical Exam  Exercise Activities and Dietary recommendations Goals   None     Immunization History  Administered Date(s) Administered  . Tdap 06/01/2008    Health Maintenance  Topic Date Due  . HIV Screening  09/04/1969  . INFLUENZA VACCINE  01/01/2018  . TETANUS/TDAP  06/01/2018  . COLONOSCOPY  07/22/2018  . Hepatitis C Screening  Completed     Discussed health benefits of physical activity, and encouraged him to engage in regular exercise appropriate for his age and condition.  1. Annual physical exam  - CBC with Differential/Platelet - Comprehensive metabolic panel - Lipid Panel With LDL/HDL Ratio - TSH - POCT urinalysis dipstick  2. Prostate cancer screening  - PSA  3. Colon cancer screening  - Ambulatory referral to Gastroenterology  4. Essential (primary)  hypertension   5. H/o Peptic ulcer    I have done the exam and reviewed the chart and it is accurate to the best of my knowledge. Dentist has been used and  any errors in dictation or transcription are unintentional. Julieanne Manson M.D. [facility]

## 2018-02-11 ENCOUNTER — Telehealth: Payer: Self-pay

## 2018-02-11 LAB — PSA: Prostate Specific Ag, Serum: 1.9 ng/mL (ref 0.0–4.0)

## 2018-02-11 LAB — CBC WITH DIFFERENTIAL/PLATELET
BASOS ABS: 0 10*3/uL (ref 0.0–0.2)
Basos: 1 %
EOS (ABSOLUTE): 0.2 10*3/uL (ref 0.0–0.4)
Eos: 3 %
HEMOGLOBIN: 14.6 g/dL (ref 13.0–17.7)
Hematocrit: 42 % (ref 37.5–51.0)
IMMATURE GRANS (ABS): 0 10*3/uL (ref 0.0–0.1)
Immature Granulocytes: 0 %
LYMPHS: 25 %
Lymphocytes Absolute: 1.4 10*3/uL (ref 0.7–3.1)
MCH: 31.3 pg (ref 26.6–33.0)
MCHC: 34.8 g/dL (ref 31.5–35.7)
MCV: 90 fL (ref 79–97)
MONOCYTES: 11 %
Monocytes Absolute: 0.6 10*3/uL (ref 0.1–0.9)
Neutrophils Absolute: 3.3 10*3/uL (ref 1.4–7.0)
Neutrophils: 60 %
Platelets: 240 10*3/uL (ref 150–450)
RBC: 4.66 x10E6/uL (ref 4.14–5.80)
RDW: 14 % (ref 12.3–15.4)
WBC: 5.4 10*3/uL (ref 3.4–10.8)

## 2018-02-11 LAB — COMPREHENSIVE METABOLIC PANEL
ALBUMIN: 4.2 g/dL (ref 3.6–4.8)
ALK PHOS: 67 IU/L (ref 39–117)
ALT: 13 IU/L (ref 0–44)
AST: 13 IU/L (ref 0–40)
Albumin/Globulin Ratio: 1.8 (ref 1.2–2.2)
BUN / CREAT RATIO: 12 (ref 10–24)
BUN: 11 mg/dL (ref 8–27)
Bilirubin Total: 0.6 mg/dL (ref 0.0–1.2)
CO2: 23 mmol/L (ref 20–29)
CREATININE: 0.9 mg/dL (ref 0.76–1.27)
Calcium: 9.5 mg/dL (ref 8.6–10.2)
Chloride: 107 mmol/L — ABNORMAL HIGH (ref 96–106)
GFR calc non Af Amer: 91 mL/min/{1.73_m2} (ref >59)
GFR, EST AFRICAN AMERICAN: 105 mL/min/{1.73_m2} (ref >59)
GLUCOSE: 96 mg/dL (ref 65–99)
Globulin, Total: 2.4 g/dL (ref 1.5–4.5)
Potassium: 5.1 mmol/L (ref 3.5–5.2)
Sodium: 146 mmol/L — ABNORMAL HIGH (ref 134–144)
TOTAL PROTEIN: 6.6 g/dL (ref 6.0–8.5)

## 2018-02-11 LAB — LIPID PANEL WITH LDL/HDL RATIO
Cholesterol, Total: 167 mg/dL (ref 100–199)
HDL: 41 mg/dL (ref >39)
LDL CALC: 101 mg/dL — AB (ref 0–99)
LDL/HDL RATIO: 2.5 ratio (ref 0.0–3.6)
Triglycerides: 126 mg/dL (ref 0–149)
VLDL Cholesterol Cal: 25 mg/dL (ref 5–40)

## 2018-02-11 LAB — TSH: TSH: 1.97 u[IU]/mL (ref 0.450–4.500)

## 2018-02-11 NOTE — Telephone Encounter (Signed)
Patient returned call regarding colonoscopy referral.  He states he not due until February. I informed him that I will defer his referral until December and call him back after the holidays.  Thanks [employer]

## 2018-02-11 NOTE — Telephone Encounter (Signed)
LVM for pt to call office regarding colonoscopy referral.  Thanks Imara Standiford 

## 2018-08-13 ENCOUNTER — Encounter: Payer: Self-pay | Admitting: Family Medicine

## 2018-09-01 ENCOUNTER — Other Ambulatory Visit: Payer: Self-pay

## 2018-09-01 DIAGNOSIS — Z1211 Encounter for screening for malignant neoplasm of colon: Secondary | ICD-10-CM

## 2018-10-28 ENCOUNTER — Other Ambulatory Visit: Payer: Self-pay | Admitting: Family Medicine

## 2018-10-28 DIAGNOSIS — I10 Essential (primary) hypertension: Secondary | ICD-10-CM

## 2018-12-01 ENCOUNTER — Other Ambulatory Visit: Payer: Self-pay | Admitting: Family Medicine

## 2019-05-18 ENCOUNTER — Other Ambulatory Visit: Payer: Self-pay | Admitting: Family Medicine

## 2019-05-18 DIAGNOSIS — I10 Essential (primary) hypertension: Secondary | ICD-10-CM

## 2019-05-18 NOTE — Telephone Encounter (Signed)
Requested medication (s) are due for refill today: yes  Requested medication (s) are on the active medication list: yes  Last refill:  01/30/2019  Future visit scheduled: no  Notes to clinic: LOV-02/15/2018 Review for refill  Requested Prescriptions  Pending Prescriptions Disp Refills   carvedilol (COREG) 6.25 MG tablet [Pharmacy Med Name: CARVEDILOL 6.25 MG TABLET] 180 tablet 0    Sig: Take 1 tablet (6.25 mg total) by mouth 2 (two) times daily with a meal.      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

## 2019-05-19 ENCOUNTER — Other Ambulatory Visit: Payer: Self-pay

## 2019-05-19 ENCOUNTER — Ambulatory Visit: Payer: HRSA Program | Attending: Internal Medicine

## 2019-05-19 DIAGNOSIS — Z20822 Contact with and (suspected) exposure to covid-19: Secondary | ICD-10-CM

## 2019-05-19 DIAGNOSIS — U071 COVID-19: Secondary | ICD-10-CM | POA: Insufficient documentation

## 2019-05-19 DIAGNOSIS — Z20828 Contact with and (suspected) exposure to other viral communicable diseases: Secondary | ICD-10-CM | POA: Diagnosis present

## 2019-05-20 NOTE — Progress Notes (Signed)
Moving orders to this encounter.  

## 2019-05-20 NOTE — Progress Notes (Signed)
Order(s) created erroneously. Erroneous order ID: [id number]  Order moved by: Vonette Grosso M  Order move date/time: 05/20/2019 6:50 PM  Source Patient: [id number]  Source Contact: 05/19/2019  Destination Patient: [id number]  Destination Contact: 05/19/2019 

## 2019-05-20 NOTE — Progress Notes (Signed)
Order(s) created erroneously. Erroneous order ID: [id number]  Order moved by: Brigitte Pulse  Order move date/time: 05/20/2019 6:50 PM  Source Patient: [id number]  Source Contact: 05/19/2019  Destination Patient: [id number]  Destination Contact: 05/19/2019

## 2019-05-21 LAB — NOVEL CORONAVIRUS, NAA: SARS-CoV-2, NAA: DETECTED — AB

## 2019-05-22 ENCOUNTER — Telehealth: Payer: Self-pay | Admitting: Unknown Physician Specialty

## 2019-05-22 NOTE — Telephone Encounter (Signed)
Called to discuss with patient about Covid symptoms and the use of bamlanivimab, a monoclonal antibody infusion for those with mild to moderate Covid symptoms and at a high risk of hospitalization.  Pt is not qualified for this infusion as symptoms greater than 10 days.

## 2019-05-22 NOTE — Telephone Encounter (Signed)
Called to discuss with patient about Covid symptoms and the use of bamlanivimab, a monoclonal antibody infusion for those with mild to moderate Covid symptoms and at a high risk of hospitalization.  Pt is qualified for this infusion at the [facility] due to Age >55 and Hypertension   Message left to call back  

## 2019-08-23 ENCOUNTER — Encounter: Payer: Self-pay | Admitting: Family Medicine

## 2019-08-24 ENCOUNTER — Other Ambulatory Visit: Payer: Self-pay

## 2019-08-24 DIAGNOSIS — I10 Essential (primary) hypertension: Secondary | ICD-10-CM

## 2019-08-24 MED ORDER — CARVEDILOL 6.25 MG PO TABS: 6.2500 mg | ORAL_TABLET | Freq: Two times a day (BID) | ORAL | 0 refills | Status: DC

## 2019-08-31 ENCOUNTER — Telehealth: Payer: Self-pay

## 2019-08-31 NOTE — Telephone Encounter (Signed)
Called to schedule patient for follow up appointment and labs since he hasn't had labs since 2019.

## 2019-09-06 ENCOUNTER — Encounter: Payer: Self-pay | Admitting: Family Medicine

## 2019-09-30 ENCOUNTER — Ambulatory Visit: Payer: Self-pay | Admitting: Family Medicine

## 2019-10-01 NOTE — Progress Notes (Signed)
Established patient visit   Patient: Eduardo Perez   DOB: [birth year]   64 y.o. Male  MRN: [id number] Visit Date: 10/04/2019  Today's healthcare provider: Wilhemena Durie, MD   Chief Complaint  Patient presents with  . Labs Only  . Hypertension   Subjective    HPI  Patient had Covid in late March 2020.  He did well.  He still has decreased smell but otherwise has had no complaints with this.  He was not hospitalized.  He feels well at home.  Blood pressure 350 systolic at home.  He has not been here since 2019. Hypertension, follow-up  BP Readings from Last 3 Encounters:  10/04/19 (!) 145/82  02/05/18 (!) 168/80  01/23/17 116/78   Wt Readings from Last 3 Encounters:  10/04/19 205 lb 6.4 oz (93.2 kg)  02/05/18 200 lb (90.7 kg)  01/23/17 194 lb (88 kg)     He was last seen for hypertension over 1 years ago.  BP at that visit was 168/80. Management since that visit includes no changes.  He reports excellent compliance with treatment. He is not having side effects.  He is following a Low fat diet. He is not exercising. He does not smoke.  Use of agents associated with hypertension: none.   Outside blood pressures are  130s over 70. Symptoms: No chest pain No chest pressure No palpitations No dyspnea No orthopnea No paroxysmal nocturnal dyspnea No lower extremity edema No syncope   Pertinent labs: Lab Results  Component Value Date   CHOL 167 02/10/2018   HDL 41 02/10/2018   LDLCALC 101 (H) 02/10/2018   TRIG 126 02/10/2018   Lab Results  Component Value Date   NA 146 (H) 02/10/2018   K 5.1 02/10/2018   CO2 23 02/10/2018   GLUCOSE 96 02/10/2018   BUN 11 02/10/2018   CREATININE 0.90 02/10/2018   CALCIUM 9.5 02/10/2018   GFRNONAA 91 02/10/2018   GFRAA 105 02/10/2018     The 10-year ASCVD risk score Mikey Bussing DC Jr., et al., 2013) is: 17.9%   ---------------------------------------------------------------------------------------------------  Past  Medical History:  Diagnosis Date  . Adiposity 12/06/2014  . Avitaminosis D 12/06/2014  . Essential (primary) hypertension 12/06/2014   Intolerant of Losartan-palpitation.   . Gout   . Hypertension   . 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 Peptic ulcer 12/06/2014   History of. No NSAIDs.        Medications: Outpatient Medications Prior to Visit  Medication Sig  . allopurinol (ZYLOPRIM) 100 MG tablet Take 1 tablet (100 mg total) by mouth daily.  . carvedilol (COREG) 6.25 MG tablet Take 1 tablet (6.25 mg total) by mouth 2 (two) times daily with a meal.  . Multiple Vitamin (MULTIVITAMIN) tablet Take 1 tablet by mouth daily.  Marland Kitchen allopurinol (ZYLOPRIM) 300 MG tablet Take 1 tablet (300 mg total) by mouth daily. (Patient not taking: Reported on 10/04/2019)   No facility-administered medications prior to visit.    Review of Systems  Constitutional: Negative for appetite change, chills and fever.  HENT:       Loss of smell  Eyes: Negative.   Respiratory: Negative for chest tightness, shortness of breath and wheezing.   Cardiovascular: Negative for chest pain and palpitations.  Gastrointestinal: Negative for abdominal pain, nausea and vomiting.  Endocrine: Negative.   Musculoskeletal: Negative.   Allergic/Immunologic: Negative.   Hematological: Negative.   Psychiatric/Behavioral: Negative.        Objective  BP (!) 145/82 (BP Location: Right Arm, Patient Position: Sitting, Cuff Size: Normal)   Pulse 67   Temp (!) 97.3 F (36.3 C) (Temporal)   Ht 5\' 11"  (1.803 m)   Wt 205 lb 6.4 oz (93.2 kg)   BMI 28.65 kg/m  BP Readings from Last 3 Encounters:  10/04/19 (!) 145/82  02/05/18 (!) 168/80  01/23/17 116/78   Wt Readings from Last 3 Encounters:  10/04/19 205 lb 6.4 oz (93.2 kg)  02/05/18 200 lb (90.7 kg)  01/23/17 194 lb (88 kg)      Physical Exam Vitals reviewed.  HENT:     Head: Normocephalic and atraumatic.     Right Ear: External ear normal.     Left Ear: External  ear normal.     Nose: Nose normal.  Eyes:     General: No scleral icterus.    Conjunctiva/sclera: Conjunctivae normal.  Cardiovascular:     Rate and Rhythm: Normal rate and regular rhythm.  Pulmonary:     Effort: Pulmonary effort is normal.     Breath sounds: Normal breath sounds.  Abdominal:     Palpations: Abdomen is soft.  Lymphadenopathy:     Cervical: No cervical adenopathy.  Skin:    General: Skin is warm and dry.  Neurological:     General: No focal deficit present.     Mental Status: He is alert and oriented to person, place, and time.  Psychiatric:        Mood and Affect: Mood normal.        Behavior: Behavior normal.        Thought Content: Thought content normal.        Judgment: Judgment normal.       No results found for any visits on 10/04/19.  Assessment & Plan     1. Essential (primary) hypertension Fair control.  Bring a blood pressure cuff on his next visit.  On Coreg only - Uric acid - PSA - TSH - Lipid panel - CBC with Differential/Platelet - Comprehensive metabolic panel  2. Acute idiopathic gout of right foot Check uric acid - Uric acid - PSA - TSH - Lipid panel - CBC with Differential/Platelet - Comprehensive metabolic panel  3. Hypercholesteremia Check lipids - Uric acid - PSA - TSH - Lipid panel - CBC with Differential/Platelet - Comprehensive metabolic panel  4. Screening for prostate cancer  - Uric acid - PSA - TSH - Lipid panel - CBC with Differential/Platelet - Comprehensive metabolic panel   No follow-ups on file.      I, 12/04/19, MD, have reviewed all documentation for this visit. The documentation on 10/05/19 for the exam, diagnosis, procedures, and orders are all accurate and complete.    Tezra Mahr 12/05/19, MD  [facility] [phone] (phone) [phone] (fax)  [facility]

## 2019-10-04 ENCOUNTER — Ambulatory Visit (INDEPENDENT_AMBULATORY_CARE_PROVIDER_SITE_OTHER): Payer: Medicare Other | Admitting: Family Medicine

## 2019-10-04 ENCOUNTER — Other Ambulatory Visit: Payer: Self-pay

## 2019-10-04 ENCOUNTER — Encounter: Payer: Self-pay | Admitting: Family Medicine

## 2019-10-04 ENCOUNTER — Telehealth: Payer: Self-pay

## 2019-10-04 VITALS — BP 145/82 | HR 67 | Temp 97.3°F | Ht 71.0 in | Wt 205.4 lb

## 2019-10-04 DIAGNOSIS — Z125 Encounter for screening for malignant neoplasm of prostate: Secondary | ICD-10-CM

## 2019-10-04 DIAGNOSIS — M10071 Idiopathic gout, right ankle and foot: Secondary | ICD-10-CM

## 2019-10-04 DIAGNOSIS — I10 Essential (primary) hypertension: Secondary | ICD-10-CM | POA: Diagnosis not present

## 2019-10-04 DIAGNOSIS — E7800 Pure hypercholesterolemia, unspecified: Secondary | ICD-10-CM

## 2019-10-04 NOTE — Telephone Encounter (Signed)
Copied from CRM [id number]. Topic: General - Other >> Oct 04, 2019  9:07 AM Dalphine Handing A wrote: Hilda Lias from [facility] called wanting to know if Dr. Sullivan Lone was in network for [facility] medicare advantage plan hmo and if there is a copay expected from patient today. Hilda Lias has asked that we reach out to patient with this information. Please advise

## 2019-10-04 NOTE — Telephone Encounter (Signed)
Spoke with patient and advised. KW

## 2019-10-07 ENCOUNTER — Telehealth: Payer: Self-pay

## 2019-10-07 LAB — LIPID PANEL
Chol/HDL Ratio: 4.3 ratio (ref 0.0–5.0)
Cholesterol, Total: 185 mg/dL (ref 100–199)
HDL: 43 mg/dL (ref >39)
LDL Chol Calc (NIH): 117 mg/dL — ABNORMAL HIGH (ref 0–99)
Triglycerides: 138 mg/dL (ref 0–149)
VLDL Cholesterol Cal: 25 mg/dL (ref 5–40)

## 2019-10-07 LAB — COMPREHENSIVE METABOLIC PANEL
ALT: 14 IU/L (ref 0–44)
AST: 16 IU/L (ref 0–40)
Albumin/Globulin Ratio: 1.9 (ref 1.2–2.2)
Albumin: 4.4 g/dL (ref 3.8–4.8)
Alkaline Phosphatase: 82 IU/L (ref 39–117)
BUN/Creatinine Ratio: 12 (ref 10–24)
BUN: 11 mg/dL (ref 8–27)
Bilirubin Total: 0.4 mg/dL (ref 0.0–1.2)
CO2: 23 mmol/L (ref 20–29)
Calcium: 9.3 mg/dL (ref 8.6–10.2)
Chloride: 100 mmol/L (ref 96–106)
Creatinine, Ser: 0.91 mg/dL (ref 0.76–1.27)
GFR calc Af Amer: 102 mL/min/{1.73_m2} (ref >59)
GFR calc non Af Amer: 88 mL/min/{1.73_m2} (ref >59)
Globulin, Total: 2.3 g/dL (ref 1.5–4.5)
Glucose: 98 mg/dL (ref 65–99)
Potassium: 4.5 mmol/L (ref 3.5–5.2)
Sodium: 137 mmol/L (ref 134–144)
Total Protein: 6.7 g/dL (ref 6.0–8.5)

## 2019-10-07 LAB — CBC WITH DIFFERENTIAL/PLATELET
Basophils Absolute: 0.1 10*3/uL (ref 0.0–0.2)
Basos: 1 %
EOS (ABSOLUTE): 0.1 10*3/uL (ref 0.0–0.4)
Eos: 2 %
Hematocrit: 45.2 % (ref 37.5–51.0)
Hemoglobin: 15.1 g/dL (ref 13.0–17.7)
Immature Grans (Abs): 0 10*3/uL (ref 0.0–0.1)
Immature Granulocytes: 0 %
Lymphocytes Absolute: 1.6 10*3/uL (ref 0.7–3.1)
Lymphs: 27 %
MCH: 30.6 pg (ref 26.6–33.0)
MCHC: 33.4 g/dL (ref 31.5–35.7)
MCV: 92 fL (ref 79–97)
Monocytes Absolute: 0.5 10*3/uL (ref 0.1–0.9)
Monocytes: 9 %
Neutrophils Absolute: 3.8 10*3/uL (ref 1.4–7.0)
Neutrophils: 61 %
Platelets: 251 10*3/uL (ref 150–450)
RBC: 4.94 x10E6/uL (ref 4.14–5.80)
RDW: 13 % (ref 11.6–15.4)
WBC: 6.1 10*3/uL (ref 3.4–10.8)

## 2019-10-07 LAB — URIC ACID: Uric Acid: 6.7 mg/dL (ref 3.8–8.4)

## 2019-10-07 LAB — TSH: TSH: 1.62 u[IU]/mL (ref 0.450–4.500)

## 2019-10-07 LAB — PSA: Prostate Specific Ag, Serum: 2.2 ng/mL (ref 0.0–4.0)

## 2019-10-07 NOTE — Telephone Encounter (Signed)
Patient advised.

## 2019-10-07 NOTE — Progress Notes (Signed)
Medicare Initial Preventative Physical Exam   I,Elena D DeSanto,acting as a scribe for Megan Mans, MD.,have documented all relevant documentation on the behalf of Megan Mans, MD,as directed by  Megan Mans, MD while in the presence of Megan Mans, MD.       Patient: Eduardo Perez, Male    DOB: 22-Feb-1956, 64 y.o.   MRN: [id number] Visit Date: 10/13/2019  Today's Provider: Megan Mans, MD   Subjective:    Chief Complaint  Patient presents with  . Medicare Wellness    Medicare Initial Preventative Physical Exam ROSHAD HACK is a 64 y.o. male who presents today for his Initial Preventative Physical Exam.   HPI Patient feels well and has no complaints or concerns. Social History   Socioeconomic History  . Marital status: Married    Spouse name: Not on file  . Number of children: Not on file  . Years of education: Not on file  . Highest education level: Not on file  Occupational History  . Not on file  Tobacco Use  . Smoking status: Former Smoker    Types: Cigars  . Smokeless tobacco: Never Used  . Tobacco comment: former smoker; smoked for about 20 years; quit July 1996  Substance and Sexual Activity  . Alcohol use: Yes    Comment: Occasional alcohol; usually beer, sometimes wine  . Drug use: No  . Sexual activity: Not on file  Other Topics Concern  . Not on file  Social History Narrative  . Not on file   Social Determinants of Health   Financial Resource Strain:   . Difficulty of Paying Living Expenses:   Food Insecurity:   . Worried About Programme researcher, broadcasting/film/video in the Last Year:   . Barista in the Last Year:   Transportation Needs:   . Freight forwarder (Medical):   Marland Kitchen Lack of Transportation (Non-Medical):   Physical Activity:   . Days of Exercise per Week:   . Minutes of Exercise per Session:   Stress:   . Feeling of Stress :   Social Connections:   . Frequency of Communication with Friends and Family:    . Frequency of Social Gatherings with Friends and Family:   . Attends Religious Services:   . Active Member of Clubs or Organizations:   . Attends Banker Meetings:   Marland Kitchen Marital Status:   Intimate Partner Violence:   . Fear of Current or Ex-Partner:   . Emotionally Abused:   Marland Kitchen Physically Abused:   . Sexually Abused:     Past Medical History:  Diagnosis Date  . Adiposity 12/06/2014  . Avitaminosis D 12/06/2014  . Essential (primary) hypertension 12/06/2014   Intolerant of Losartan-palpitation.   . Gout   . Hypertension   . 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 Peptic ulcer 12/06/2014   History of. No NSAIDs.      Patient Active Problem List   Diagnosis Date Noted  . Prostate nodule 05/16/2015  . Essential (primary) hypertension 12/06/2014  . Gout 12/06/2014  . Hypercholesteremia 12/06/2014  . Cannot sleep 12/06/2014  . Low back pain with sciatica 12/06/2014  . Malaise and fatigue 12/06/2014  . Adiposity 12/06/2014  . 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 Palpitations 12/06/2014  . Peptic ulcer 12/06/2014  . Avitaminosis D 12/06/2014    Past Surgical History:  Procedure Laterality Date  . Hx of transfusion of packed red blood cells  07/2008    His family history includes Coronary artery disease in his maternal grandfather and maternal grandmother. There is no history of Prostate cancer, Bladder Cancer, or Kidney cancer.   Current Outpatient Medications:  .  allopurinol (ZYLOPRIM) 100 MG tablet, Take 1 tablet (100 mg total) by mouth daily., Disp: 90 tablet, Rfl: 3 .  carvedilol (COREG) 6.25 MG tablet, Take 1 tablet (6.25 mg total) by mouth 2 (two) times daily with a meal., Disp: 180 tablet, Rfl: 0 .  Multiple Vitamin (MULTIVITAMIN) tablet, Take 1 tablet by mouth daily., Disp: , Rfl:    Patient Care Team: Jerrol Banana., MD as PCP - General (Family Medicine)  Review of Systems  Constitutional: Negative.   HENT: Negative.   Eyes:  Negative.   Respiratory: Negative.   Cardiovascular: Positive for palpitations.  Gastrointestinal: Negative.   Endocrine: Negative.   Genitourinary: Negative.   Musculoskeletal: Negative.   Skin: Negative.   Allergic/Immunologic: Negative.   Neurological: Negative.   Hematological: Negative.   Psychiatric/Behavioral: Negative.         Objective:    Vitals: BP 132/80 (BP Location: Left Arm, Patient Position: Sitting, Cuff Size: Normal)   Pulse 68   Temp (!) 96.9 F (36.1 C) (Skin)   Ht 5\' 11"  (1.803 m)   Wt 204 lb (92.5 kg)   SpO2 99%   BMI 28.45 kg/m  No exam data present Physical Exam Vitals reviewed.  Constitutional:      Appearance: He is well-developed.  HENT:     Head: Normocephalic and atraumatic.     Right Ear: External ear normal.     Left Ear: External ear normal.     Nose: Nose normal.  Eyes:     Conjunctiva/sclera: Conjunctivae normal.     Pupils: Pupils are equal, round, and reactive to light.  Cardiovascular:     Rate and Rhythm: Normal rate and regular rhythm.     Heart sounds: Normal heart sounds.  Pulmonary:     Effort: Pulmonary effort is normal.     Breath sounds: Normal breath sounds.  Abdominal:     General: Bowel sounds are normal.     Palpations: Abdomen is soft.  Genitourinary:    Penis: Normal.      Testes: Normal.  Musculoskeletal:        General: Normal range of motion.     Cervical back: Normal range of motion and neck supple.  Skin:    General: Skin is warm and dry.  Neurological:     General: No focal deficit present.     Mental Status: He is alert and oriented to person, place, and time.  Psychiatric:        Mood and Affect: Mood normal.        Behavior: Behavior normal.        Thought Content: Thought content normal.        Judgment: Judgment normal.   ECG reveals normal sinus rhythm with no ischemic changes.   Activities of Daily Living In your present state of health, do you have any difficulty performing the  following activities: 10/13/2019 10/04/2019  Hearing? N N  Vision? N N  Difficulty concentrating or making decisions? N N  Walking or climbing stairs? N N  Dressing or bathing? N N  Doing errands, shopping? N N  Some recent data might be hidden    Fall Risk Assessment Fall Risk  10/13/2019 10/04/2019 02/05/2018 05/04/2015  Falls in the past year? 0 0  No No  Number falls in past yr: - 0 - -  Injury with Fall? - 0 - -  Follow up - Falls evaluation completed - -     Depression Screen PHQ 2/9 Scores 10/13/2019 10/04/2019 02/05/2018 01/23/2017  PHQ - 2 Score 0 0 0 0  PHQ- 9 Score - 0 - 2    No flowsheet data found.    Assessment & Plan:    Initial Preventative Physical Exam  Reviewed patient's Family Medical History Reviewed and updated list of patient's medical providers Assessment of cognitive impairment was done Assessed patient's functional ability Established a written schedule for health screening services Health Risk Assessent Completed and Reviewed  Exercise Activities and Dietary recommendations Goals   None     Immunization History  Administered Date(s) Administered  . Tdap 06/01/2008    Health Maintenance  Topic Date Due  . HIV Screening  Never done  . COVID-19 Vaccine (1) Never done  . TETANUS/TDAP  06/01/2018  . COLONOSCOPY  07/22/2018  . PNA vac Low Risk Adult (1 of 2 - PCV13) Never done  . INFLUENZA VACCINE  01/02/2020  . Hepatitis C Screening  Completed     Discussed health benefits of physical activity, and encouraged him to engage in regular exercise appropriate for his age and condition.  Patient advised to exercise regularly.  Get a new blood pressure cuff and bring in cuff and readings on next visit in 6 months.  May need to go up on the Coreg dose.  Referral for screening colonoscopy made.    I, Megan Mans, MD, have reviewed all documentation for this visit. The documentation on 10/13/19 for the exam, diagnosis, procedures, and orders are all  accurate and complete.   Richard Wendelyn Breslow, MD  [facility] [phone] (phone) [phone] (fax)  [facility]

## 2019-10-07 NOTE — Telephone Encounter (Signed)
-----   Message from Maple Hudson., MD sent at 10/07/2019  1:59 PM EDT ----- Labs stable

## 2019-10-13 ENCOUNTER — Other Ambulatory Visit: Payer: Self-pay

## 2019-10-13 ENCOUNTER — Ambulatory Visit (INDEPENDENT_AMBULATORY_CARE_PROVIDER_SITE_OTHER): Payer: Medicare Other | Admitting: Family Medicine

## 2019-10-13 VITALS — BP 132/80 | HR 68 | Temp 96.9°F | Ht 71.0 in | Wt 204.0 lb

## 2019-10-13 DIAGNOSIS — Z0000 Encounter for general adult medical examination without abnormal findings: Secondary | ICD-10-CM | POA: Diagnosis not present

## 2019-10-13 DIAGNOSIS — Z1211 Encounter for screening for malignant neoplasm of colon: Secondary | ICD-10-CM | POA: Diagnosis not present

## 2019-10-18 ENCOUNTER — Telehealth (INDEPENDENT_AMBULATORY_CARE_PROVIDER_SITE_OTHER): Payer: Self-pay | Admitting: Gastroenterology

## 2019-10-18 ENCOUNTER — Other Ambulatory Visit (INDEPENDENT_AMBULATORY_CARE_PROVIDER_SITE_OTHER): Payer: Self-pay

## 2019-10-18 DIAGNOSIS — Z1211 Encounter for screening for malignant neoplasm of colon: Secondary | ICD-10-CM

## 2019-10-18 MED ORDER — NA SULFATE-K SULFATE-MG SULF 17.5-3.13-1.6 GM/177ML PO SOLN: 1.0000 | Freq: Once | ORAL | 0 refills | Status: AC

## 2019-10-18 NOTE — Progress Notes (Signed)
Gastroenterology Pre-Procedure Review  Request Date: Thursday 11/11/19 Requesting Physician: Dr. Servando Snare  PATIENT REVIEW QUESTIONS: The patient responded to the following health history questions as indicated:    1. Are you having any GI issues? no 2. Do you have a personal history of Polyps? no 3. Do you have a family history of Colon Cancer or Polyps? no 4. Diabetes Mellitus? no 5. Joint replacements in the past 12 months?no 6. Major health problems in the past 3 months?no 7. Any artificial heart valves, MVP, or defibrillator?no    MEDICATIONS & ALLERGIES:    Patient reports the following regarding taking any anticoagulation/antiplatelet therapy:   Plavix, Coumadin, Eliquis, Xarelto, Lovenox, Pradaxa, Brilinta, or Effient? no Aspirin? no  Patient confirms/reports the following medications:  Current Outpatient Medications  Medication Sig Dispense Refill  . carvedilol (COREG) 6.25 MG tablet Take 1 tablet (6.25 mg total) by mouth 2 (two) times daily with a meal. 180 tablet 0  . Multiple Vitamin (MULTIVITAMIN) tablet Take 1 tablet by mouth daily.    Marland Kitchen allopurinol (ZYLOPRIM) 100 MG tablet Take 1 tablet (100 mg total) by mouth daily. (Patient not taking: Reported on 10/18/2019) 90 tablet 3   No current facility-administered medications for this visit.    Patient confirms/reports the following allergies:  Allergies  Allergen Reactions  . Losartan     palpitation    No orders of the defined types were placed in this encounter.   AUTHORIZATION INFORMATION Primary Insurance: 1D#: Group #:  Secondary Insurance: 1D#: Group #:  SCHEDULE INFORMATION: Date: Thursday 11/11/19 Time: Location:[facility]

## 2019-11-03 ENCOUNTER — Other Ambulatory Visit: Payer: Self-pay

## 2019-11-03 ENCOUNTER — Encounter: Payer: Self-pay | Admitting: Gastroenterology

## 2019-11-09 ENCOUNTER — Other Ambulatory Visit: Payer: Self-pay

## 2019-11-09 ENCOUNTER — Other Ambulatory Visit
Admission: RE | Admit: 2019-11-09 | Discharge: 2019-11-09 | Disposition: A | Discharge status: Home / Self Care | Payer: Medicare Other | Source: Ambulatory Visit | Attending: Gastroenterology | Admitting: Gastroenterology

## 2019-11-09 DIAGNOSIS — Z20822 Contact with and (suspected) exposure to covid-19: Secondary | ICD-10-CM | POA: Diagnosis not present

## 2019-11-09 DIAGNOSIS — Z01812 Encounter for preprocedural laboratory examination: Secondary | ICD-10-CM | POA: Diagnosis not present

## 2019-11-09 LAB — SARS CORONAVIRUS 2 (TAT 6-24 HRS): SARS Coronavirus 2: NEGATIVE

## 2019-11-10 NOTE — Discharge Instructions (Signed)
General Anesthesia, Adult, Care After This sheet gives you information about how to care for yourself after your procedure. Your health care provider may also give you more specific instructions. If you have problems or questions, contact your health care provider. What can I expect after the procedure? After the procedure, the following side effects are common:  Pain or discomfort at the IV site.  Nausea.  Vomiting.  Sore throat.  Trouble concentrating.  Feeling cold or chills.  Weak or tired.  Sleepiness and fatigue.  Soreness and body aches. These side effects can affect parts of the body that were not involved in surgery. Follow these instructions at home:  For at least 24 hours after the procedure:  Have a responsible adult stay with you. It is important to have someone help care for you until you are awake and alert.  Rest as needed.  Do not: ? Participate in activities in which you could fall or become injured. ? Drive. ? Use heavy machinery. ? Drink alcohol. ? Take sleeping pills or medicines that cause drowsiness. ? Make important decisions or sign legal documents. ? Take care of children on your own. Eating and drinking  Follow any instructions from your health care provider about eating or drinking restrictions.  When you feel hungry, start by eating small amounts of foods that are soft and easy to digest (bland), such as toast. Gradually return to your regular diet.  Drink enough fluid to keep your urine pale yellow.  If you vomit, rehydrate by drinking water, juice, or clear broth. General instructions  If you have sleep apnea, surgery and certain medicines can increase your risk for breathing problems. Follow instructions from your health care provider about wearing your sleep device: ? Anytime you are sleeping, including during daytime naps. ? While taking prescription pain medicines, sleeping medicines, or medicines that make you drowsy.  Return to  your normal activities as told by your health care provider. Ask your health care provider what activities are safe for you.  Take over-the-counter and prescription medicines only as told by your health care provider.  If you smoke, do not smoke without supervision.  Keep all follow-up visits as told by your health care provider. This is important. Contact a health care provider if:  You have nausea or vomiting that does not get better with medicine.  You cannot eat or drink without vomiting.  You have pain that does not get better with medicine.  You are unable to pass urine.  You develop a skin rash.  You have a fever.  You have redness around your IV site that gets worse. Get help right away if:  You have difficulty breathing.  You have chest pain.  You have blood in your urine or stool, or you vomit blood. Summary  After the procedure, it is common to have a sore throat or nausea. It is also common to feel tired.  Have a responsible adult stay with you for the first 24 hours after general anesthesia. It is important to have someone help care for you until you are awake and alert.  When you feel hungry, start by eating small amounts of foods that are soft and easy to digest (bland), such as toast. Gradually return to your regular diet.  Drink enough fluid to keep your urine pale yellow.  Return to your normal activities as told by your health care provider. Ask your health care provider what activities are safe for you. This information is not   intended to replace advice given to you by your health care provider. Make sure you discuss any questions you have with your health care provider. Document Revised: 05/23/2017 Document Reviewed: 01/03/2017 Elsevier Patient Education  2020 Elsevier Inc.  

## 2019-11-11 ENCOUNTER — Encounter
Admission: RE | Disposition: A | Discharge status: Home / Self Care | Payer: Self-pay | Source: Home / Self Care | Attending: Gastroenterology

## 2019-11-11 ENCOUNTER — Encounter: Payer: Self-pay | Admitting: Gastroenterology

## 2019-11-11 ENCOUNTER — Ambulatory Visit: Payer: Medicare Other | Admitting: Anesthesiology

## 2019-11-11 ENCOUNTER — Other Ambulatory Visit: Payer: Self-pay

## 2019-11-11 ENCOUNTER — Ambulatory Visit
Admission: RE | Admit: 2019-11-11 | Discharge: 2019-11-11 | Disposition: A | Discharge status: Home / Self Care | Payer: Medicare Other | Attending: Gastroenterology | Admitting: Gastroenterology

## 2019-11-11 DIAGNOSIS — Z8711 Personal history of peptic ulcer disease: Secondary | ICD-10-CM | POA: Diagnosis not present

## 2019-11-11 DIAGNOSIS — K5730 Diverticulosis of large intestine without perforation or abscess without bleeding: Secondary | ICD-10-CM | POA: Diagnosis not present

## 2019-11-11 DIAGNOSIS — I10 Essential (primary) hypertension: Secondary | ICD-10-CM | POA: Diagnosis not present

## 2019-11-11 DIAGNOSIS — Z79899 Other long term (current) drug therapy: Secondary | ICD-10-CM | POA: Diagnosis not present

## 2019-11-11 DIAGNOSIS — Z1211 Encounter for screening for malignant neoplasm of colon: Secondary | ICD-10-CM | POA: Insufficient documentation

## 2019-11-11 DIAGNOSIS — M109 Gout, unspecified: Secondary | ICD-10-CM | POA: Diagnosis not present

## 2019-11-11 DIAGNOSIS — Z888 Allergy status to other drugs, medicaments and biological substances status: Secondary | ICD-10-CM | POA: Diagnosis not present

## 2019-11-11 DIAGNOSIS — Z87891 Personal history of nicotine dependence: Secondary | ICD-10-CM | POA: Diagnosis not present

## 2019-11-11 DIAGNOSIS — K640 First degree hemorrhoids: Secondary | ICD-10-CM | POA: Insufficient documentation

## 2019-11-11 HISTORY — DX: Tachycardia, unspecified: R00.0

## 2019-11-11 HISTORY — DX: COVID-19: U07.1

## 2019-11-11 HISTORY — PX: COLONOSCOPY WITH PROPOFOL: SHX5780

## 2019-11-11 SURGERY — COLONOSCOPY WITH PROPOFOL
Anesthesia: General

## 2019-11-11 MED ORDER — PROPOFOL 10 MG/ML IV BOLUS
INTRAVENOUS | Status: DC | PRN
  Administered 2019-11-11 (×4): 30 mg via INTRAVENOUS
  Administered 2019-11-11: 150 mg via INTRAVENOUS
  Administered 2019-11-11: 30 mg via INTRAVENOUS
  Administered 2019-11-11: 20 mg via INTRAVENOUS
  Administered 2019-11-11 (×2): 30 mg via INTRAVENOUS

## 2019-11-11 MED ORDER — OXYCODONE HCL 5 MG PO TABS: 5.0000 mg | ORAL_TABLET | Freq: Once | ORAL | Status: DC | PRN

## 2019-11-11 MED ORDER — LIDOCAINE HCL (CARDIAC) PF 100 MG/5ML IV SOSY
PREFILLED_SYRINGE | INTRAVENOUS | Status: DC | PRN
  Administered 2019-11-11: 30 mg via INTRAVENOUS

## 2019-11-11 MED ORDER — OXYCODONE HCL 5 MG/5ML PO SOLN: 5.0000 mg | Freq: Once | ORAL | Status: DC | PRN

## 2019-11-11 MED ORDER — SODIUM CHLORIDE 0.9 % IV SOLN: INTRAVENOUS | Status: DC

## 2019-11-11 MED ORDER — LACTATED RINGERS IV SOLN: INTRAVENOUS | Status: DC

## 2019-11-11 MED ORDER — STERILE WATER FOR IRRIGATION IR SOLN
Status: DC | PRN
  Administered 2019-11-11: 200 mL

## 2019-11-11 SURGICAL SUPPLY — 24 items
CLIP HMST 235XBRD CATH ROT (MISCELLANEOUS) IMPLANT
CLIP RESOLUTION 360 11X235 (MISCELLANEOUS)
ELECT REM PT RETURN 9FT ADLT (ELECTROSURGICAL)
ELECTRODE REM PT RTRN 9FT ADLT (ELECTROSURGICAL) IMPLANT
FCP ESCP3.2XJMB 240X2.8X (MISCELLANEOUS)
FORCEPS BIOP RAD 4 LRG CAP 4 (CUTTING FORCEPS) IMPLANT
FORCEPS BIOP RJ4 240 W/NDL (MISCELLANEOUS)
FORCEPS ESCP3.2XJMB 240X2.8X (MISCELLANEOUS) IMPLANT
GOWN CVR UNV OPN BCK APRN NK (MISCELLANEOUS) ×2 IMPLANT
GOWN ISOL THUMB LOOP REG UNIV (MISCELLANEOUS) ×4
INJECTOR VARIJECT VIN23 (MISCELLANEOUS) IMPLANT
KIT DEFENDO VALVE AND CONN (KITS) IMPLANT
KIT ENDO PROCEDURE OLY (KITS) ×3 IMPLANT
MANIFOLD NEPTUNE II (INSTRUMENTS) IMPLANT
MARKER SPOT ENDO TATTOO 5ML (MISCELLANEOUS) IMPLANT
PROBE APC STR FIRE (PROBE) IMPLANT
RETRIEVER NET ROTH 2.5X230 LF (MISCELLANEOUS) IMPLANT
SNARE SHORT THROW 13M SML OVAL (MISCELLANEOUS) IMPLANT
SNARE SHORT THROW 30M LRG OVAL (MISCELLANEOUS) IMPLANT
SNARE SNG USE RND 15MM (INSTRUMENTS) IMPLANT
SPOT EX ENDOSCOPIC TATTOO (MISCELLANEOUS)
TRAP ETRAP POLY (MISCELLANEOUS) IMPLANT
VARIJECT INJECTOR VIN23 (MISCELLANEOUS)
WATER STERILE IRR 250ML POUR (IV SOLUTION) ×3 IMPLANT

## 2019-11-11 NOTE — Op Note (Signed)
[facility]astroenterology Patient Name: Eduardo Perez Procedure Date: 11/11/2019 7:22 AM MRN: [id number] Account #: [id number] Date of Birth: 1956-03-23 Admit Type: Outpatient Age: 64 Room: [facility] OR ROOM 01 Gender: Male Note Status: Finalized Procedure:             Colonoscopy Indications:           Screening for colorectal malignant neoplasm Providers:             Midge Minium MD, MD Referring MD:          Ferdinand Lango. Sullivan Lone, MD (Referring MD) Medicines:             Propofol per Anesthesia Complications:         No immediate complications. Procedure:             Pre-Anesthesia Assessment:                        - Prior to the procedure, a History and Physical was                         performed, and patient medications and allergies were                         reviewed. The patient's tolerance of previous                         anesthesia was also reviewed. The risks and benefits                         of the procedure and the sedation options and risks                         were discussed with the patient. All questions were                         answered, and informed consent was obtained. Prior                         Anticoagulants: The patient has taken no previous                         anticoagulant or antiplatelet agents. ASA Grade                         Assessment: II - A patient with mild systemic disease.                         After reviewing the risks and benefits, the patient                         was deemed in satisfactory condition to undergo the                         procedure.                        After obtaining informed consent, the colonoscope was  passed under direct vision. Throughout the procedure,                         the patient's blood pressure, pulse, and oxygen                         saturations were monitored continuously. The was                         introduced through the anus and  advanced to the the                         cecum, identified by appendiceal orifice and ileocecal                         valve. The colonoscopy was performed without                         difficulty. The patient tolerated the procedure well.                         The quality of the bowel preparation was excellent. Findings:      The perianal and digital rectal examinations were normal.      A few small-mouthed diverticula were found in the sigmoid colon.      Non-bleeding internal hemorrhoids were found during retroflexion. The       hemorrhoids were Grade I (internal hemorrhoids that do not prolapse). Impression:            - Diverticulosis in the sigmoid colon.                        - Non-bleeding internal hemorrhoids.                        - No specimens collected. Recommendation:        - Discharge patient to home.                        - Resume previous diet.                        - Continue present medications. Procedure Code(s):     --- Professional ---                        [id number], Colonoscopy, flexible; diagnostic, including                         collection of specimen(s) by brushing or washing, when                         performed (separate procedure) Diagnosis Code(s):     --- Professional ---                        Z12.11, Encounter for screening for malignant neoplasm                         of colon CPT copyright 2019 American Medical Association. All rights reserved. The codes documented in this report are  preliminary and upon coder review may  be revised to meet current compliance requirements. Midge Minium MD, MD 11/11/2019 8:03:04 AM This report has been signed electronically. Number of Addenda: 0 Note Initiated On: 11/11/2019 7:22 AM Scope Withdrawal Time: 0 hours 8 minutes 33 seconds  Total Procedure Duration: 0 hours 11 minutes 3 seconds  Estimated Blood Loss:  Estimated blood loss: none.      [facility]

## 2019-11-11 NOTE — Transfer of Care (Signed)
Immediate Anesthesia Transfer of Care Note  Patient: Eduardo Perez  Procedure(s) Performed: COLONOSCOPY WITH PROPOFOL (N/A )  Patient Location: PACU  Anesthesia Type: General  Level of Consciousness: awake, alert  and patient cooperative  Airway and Oxygen Therapy: Patient Spontanous Breathing and Patient connected to supplemental oxygen  Post-op Assessment: Post-op Vital signs reviewed, Patient's Cardiovascular Status Stable, Respiratory Function Stable, Patent Airway and No signs of Nausea or vomiting  Post-op Vital Signs: Reviewed and stable  Complications: No complications documented.

## 2019-11-11 NOTE — Anesthesia Postprocedure Evaluation (Signed)
Anesthesia Post Note  Patient: Eduardo Perez  Procedure(s) Performed: COLONOSCOPY WITH PROPOFOL (N/A )     Patient location during evaluation: PACU Anesthesia Type: General Level of consciousness: awake and alert Pain management: pain level controlled Vital Signs Assessment: post-procedure vital signs reviewed and stable Respiratory status: spontaneous breathing Cardiovascular status: stable Anesthetic complications: no   No complications documented.  Marvis Repress

## 2019-11-11 NOTE — Anesthesia Preprocedure Evaluation (Signed)
Anesthesia Evaluation  Patient identified by MRN, date of birth, ID band Patient awake    Reviewed: Allergy & Precautions, H&P , NPO status , Patient's Chart, lab work & pertinent test results  History of Anesthesia Complications Negative for: history of anesthetic complications  Airway Mallampati: I  TM Distance: >3 FB Neck ROM: full    Dental no notable dental hx.    Pulmonary neg pulmonary ROS, former smoker,    Pulmonary exam normal breath sounds clear to auscultation       Cardiovascular hypertension, On Medications Normal cardiovascular exam Rhythm:regular Rate:Normal     Neuro/Psych negative psych ROS   GI/Hepatic Neg liver ROS, PUD,   Endo/Other  negative endocrine ROS  Renal/GU negative Renal ROS  negative genitourinary   Musculoskeletal   Abdominal   Peds  Hematology negative hematology ROS (+)   Anesthesia Other Findings   Reproductive/Obstetrics negative OB ROS                             Anesthesia Physical Anesthesia Plan  ASA: II  Anesthesia Plan: General   Post-op Pain Management:    Induction:   PONV Risk Score and Plan:   Airway Management Planned:   Additional Equipment:   Intra-op Plan:   Post-operative Plan:   Informed Consent: I have reviewed the patients History and Physical, chart, labs and discussed the procedure including the risks, benefits and alternatives for the proposed anesthesia with the patient or authorized representative who has indicated his/her understanding and acceptance.       Plan Discussed with:   Anesthesia Plan Comments:         Anesthesia Quick Evaluation

## 2019-11-11 NOTE — Anesthesia Procedure Notes (Signed)
Date/Time: 11/11/2019 7:45 AM Performed by: Maree Krabbe, CRNA Pre-anesthesia Checklist: Patient identified, Emergency Drugs available, Suction available, Timeout performed and Patient being monitored Patient Re-evaluated:Patient Re-evaluated prior to induction Oxygen Delivery Method: Nasal cannula Placement Confirmation: positive ETCO2

## 2019-11-11 NOTE — H&P (Signed)
Eduardo Minium, MD [facility] [address] [place], [place] [address] Phone: [phone] Fax : [phone]  Primary Care Physician:  Maple Hudson., MD Primary Gastroenterologist:  Dr. Servando Snare  Pre-Procedure History & Physical: HPI:  Eduardo Perez is a 64 y.o. male is here for a screening colonoscopy.   Past Medical History:  Diagnosis Date  . Adiposity 12/06/2014  . Avitaminosis D 12/06/2014  . COVID-19    about a year ago  . Essential (primary) hypertension 12/06/2014   Intolerant of Losartan-palpitation.   . Gout   . Hypertension   . 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 Peptic ulcer 12/06/2014   History of. No NSAIDs.   . Tachycardia     Past Surgical History:  Procedure Laterality Date  . Hx of transfusion of packed red blood cells  07/2008    Prior to Admission medications   Medication Sig Start Date End Date Taking? Authorizing Provider  allopurinol (ZYLOPRIM) 100 MG tablet Take 1 tablet (100 mg total) by mouth daily. 12/01/18  Yes Maple Hudson., MD  carvedilol (COREG) 6.25 MG tablet Take 1 tablet (6.25 mg total) by mouth 2 (two) times daily with a meal. 08/24/19  Yes Maple Hudson., MD  Multiple Vitamin (MULTIVITAMIN) tablet Take 1 tablet by mouth daily. Fish oil   Yes [provider]    Allergies as of 10/18/2019 - Review Complete 10/18/2019  Allergen Reaction Noted  . Losartan  12/06/2014    Family History  Problem Relation Age of Onset  . Coronary artery disease Maternal Grandmother   . Coronary artery disease Maternal Grandfather   . Prostate cancer Neg Hx   . Bladder Cancer Neg Hx   . Kidney cancer Neg Hx     Social History   Socioeconomic History  . Marital status: Married    Spouse name: Not on file  . Number of children: Not on file  . Years of education: Not on file  . Highest education level: Not on file  Occupational History  . Not on file  Tobacco Use  . Smoking status: Former Smoker    Types: Cigars    . Smokeless tobacco: Never Used  . Tobacco comment: former smoker; smoked for about 20 years; quit July 1996  Vaping Use  . Vaping Use: Never used  Substance and Sexual Activity  . Alcohol use: Yes    Alcohol/week: 1.0 standard drink    Types: 1 Cans of beer per week    Comment: Occasional alcohol; usually beer, sometimes wine  . Drug use: No  . Sexual activity: Not on file  Other Topics Concern  . Not on file  Social History Narrative  . Not on file   Social Determinants of Health   Financial Resource Strain:   . Difficulty of Paying Living Expenses:   Food Insecurity:   . Worried About Programme researcher, broadcasting/film/video in the Last Year:   . Barista in the Last Year:   Transportation Needs:   . Freight forwarder (Medical):   Marland Kitchen Lack of Transportation (Non-Medical):   Physical Activity:   . Days of Exercise per Week:   . Minutes of Exercise per Session:   Stress:   . Feeling of Stress :   Social Connections:   . Frequency of Communication with Friends and Family:   . Frequency of Social Gatherings with Friends and Family:   . Attends Religious Services:   . Active Member  of Clubs or Organizations:   . Attends Archivist Meetings:   Marland Kitchen Marital Status:   Intimate Partner Violence:   . Fear of Current or Ex-Partner:   . Emotionally Abused:   Marland Kitchen Physically Abused:   . Sexually Abused:     Review of Systems: See HPI, otherwise negative ROS  Physical Exam: Ht 5\' 11"  (1.803 m)   Wt 88.5 kg   BMI 27.20 kg/m  General:   Alert,  pleasant and cooperative in NAD Head:  Normocephalic and atraumatic. Neck:  Supple; no masses or thyromegaly. Lungs:  Clear throughout to auscultation.    Heart:  Regular rate and rhythm. Abdomen:  Soft, nontender and nondistended. Normal bowel sounds, without guarding, and without rebound.   Neurologic:  Alert and  oriented x4;  grossly normal neurologically.  Impression/Plan: Eduardo Perez is now here to undergo a screening  colonoscopy.  Risks, benefits, and alternatives regarding colonoscopy have been reviewed with the patient.  Questions have been answered.  All parties agreeable.

## 2019-11-12 ENCOUNTER — Encounter: Payer: Self-pay | Admitting: Gastroenterology

## 2019-11-24 ENCOUNTER — Other Ambulatory Visit: Payer: Self-pay | Admitting: Family Medicine

## 2019-11-24 DIAGNOSIS — I10 Essential (primary) hypertension: Secondary | ICD-10-CM

## 2020-02-19 ENCOUNTER — Other Ambulatory Visit: Payer: Self-pay | Admitting: Family Medicine

## 2020-02-19 DIAGNOSIS — I10 Essential (primary) hypertension: Secondary | ICD-10-CM

## 2020-02-19 NOTE — Telephone Encounter (Signed)
Requested Prescriptions  Pending Prescriptions Disp Refills   carvedilol (COREG) 6.25 MG tablet [Pharmacy Med Name: CARVEDILOL 6.25 MG TABLET] 180 tablet 0    Sig: Take 1 tablet (6.25 mg total) by mouth 2 (two) times daily with a meal.     Cardiovascular:  Beta Blockers Passed - 02/19/2020  9:15 AM      Passed - Last BP in normal range    BP Readings from Last 1 Encounters:  11/11/19 98/71         Passed - Last Heart Rate in normal range    Pulse Readings from Last 1 Encounters:  11/11/19 68         Passed - Valid encounter within last 6 months    Recent Outpatient Visits          4 months ago Welcome to [employer] preventive visit   [facility] Maple Hudson., MD   4 months ago Essential (primary) hypertension   [facility] Maple Hudson., MD   2 years ago Annual physical exam   [facility] Maple Hudson., MD   3 years ago Annual physical exam   [facility] Maple Hudson., MD   3 years ago Gouty arthritis of toe of right foot   [facility] [place], [place]      Future Appointments            In 3 months Sullivan Lone, Leonette Monarch., MD [facility]

## 2020-05-31 ENCOUNTER — Other Ambulatory Visit: Payer: Self-pay | Admitting: Family Medicine

## 2020-05-31 DIAGNOSIS — I10 Essential (primary) hypertension: Secondary | ICD-10-CM

## 2020-06-14 ENCOUNTER — Ambulatory Visit: Payer: Self-pay | Admitting: Family Medicine

## 2020-08-16 ENCOUNTER — Ambulatory Visit: Payer: Self-pay | Admitting: Family Medicine

## 2020-09-11 ENCOUNTER — Other Ambulatory Visit: Payer: Self-pay | Admitting: Family Medicine

## 2020-09-11 DIAGNOSIS — I10 Essential (primary) hypertension: Secondary | ICD-10-CM

## 2020-09-11 NOTE — Telephone Encounter (Signed)
Requested medication (s) are due for refill today: yes  Requested medication (s) are on the active medication list: yes  Last refill: 05/31/2020  Future visit scheduled: no  Notes to clinic: last 2 appointments have been canceled Overdue for follow up    Requested Prescriptions  Pending Prescriptions Disp Refills   carvedilol (COREG) 6.25 MG tablet [Pharmacy Med Name: CARVEDILOL 6.25 MG TABLET] 180 tablet 0    Sig: Take 1 tablet (6.25 mg total) by mouth 2 (two) times daily with a meal.      Cardiovascular:  Beta Blockers Failed - 09/11/2020  8:38 AM      Failed - Valid encounter within last 6 months    Recent Outpatient Visits           11 months ago Welcome to [employer] preventive visit   [facility] Maple Hudson., MD   11 months ago Essential (primary) hypertension   [facility] Maple Hudson., MD   2 years ago Annual physical exam   [facility] Maple Hudson., MD   3 years ago Annual physical exam   [facility] Maple Hudson., MD   4 years ago Gouty arthritis of toe of right foot   [facility] [place], [place]                Passed - Last BP in normal range    BP Readings from Last 1 Encounters:  11/11/19 98/71          Passed - Last Heart Rate in normal range    Pulse Readings from Last 1 Encounters:  11/11/19 68

## 2020-11-06 DIAGNOSIS — H43811 Vitreous degeneration, right eye: Secondary | ICD-10-CM | POA: Diagnosis not present

## 2020-12-19 ENCOUNTER — Other Ambulatory Visit: Payer: Self-pay | Admitting: Family Medicine

## 2020-12-19 DIAGNOSIS — I10 Essential (primary) hypertension: Secondary | ICD-10-CM

## 2020-12-19 NOTE — Telephone Encounter (Signed)
Attempted to call patient to schedule appointment- over due for annual exam. Left message on VM to call office- courtesy RF given #[phone] given

## 2021-01-09 ENCOUNTER — Ambulatory Visit: Payer: Medicare Other | Admitting: Family Medicine

## 2021-01-09 ENCOUNTER — Other Ambulatory Visit: Payer: Self-pay

## 2021-01-09 ENCOUNTER — Ambulatory Visit (INDEPENDENT_AMBULATORY_CARE_PROVIDER_SITE_OTHER): Payer: Medicare Other | Admitting: Family Medicine

## 2021-01-09 ENCOUNTER — Encounter: Payer: Self-pay | Admitting: Family Medicine

## 2021-01-09 DIAGNOSIS — E7800 Pure hypercholesterolemia, unspecified: Secondary | ICD-10-CM

## 2021-01-09 DIAGNOSIS — M10071 Idiopathic gout, right ankle and foot: Secondary | ICD-10-CM

## 2021-01-09 DIAGNOSIS — I10 Essential (primary) hypertension: Secondary | ICD-10-CM

## 2021-01-09 NOTE — Patient Instructions (Signed)
Get Tetanus Vaccine From [facility]

## 2021-01-09 NOTE — Progress Notes (Signed)
I,April Miller,acting as a scribe for Megan Mans, MD.,have documented all relevant documentation on the behalf of Megan Mans, MD,as directed by  Megan Mans, MD while in the presence of Megan Mans, MD.  Established patient visit   Patient: Eduardo Perez   DOB: [birth year]   65 y.o. Male  MRN: [id number] Visit Date: 01/09/2021  Today's healthcare provider: Megan Mans, MD   Chief Complaint  Patient presents with   Follow-up   Hypertension   Hyperlipidemia   Subjective    HPI  Patient comes in today for follow-up.  Patient has no complaints and feels well. Hypertension, follow-up  BP Readings from Last 3 Encounters:  01/09/21 139/88  11/11/19 98/71  10/13/19 132/80   Wt Readings from Last 3 Encounters:  01/09/21 206 lb (93.4 kg)  11/11/19 196 lb (88.9 kg)  10/13/19 204 lb (92.5 kg)     He was last seen for hypertension 1 years ago.  BP at that visit was 132/80. Management since that visit includes no medication changes.  He reports good compliance with treatment. He is not having side effects. none He is following a Regular diet. He is exercising. He does not smoke.  Use of agents associated with hypertension: none.   Outside blood pressures are 134/76.  Pertinent labs: Lab Results  Component Value Date   CHOL 185 10/06/2019   HDL 43 10/06/2019   LDLCALC 117 (H) 10/06/2019   TRIG 138 10/06/2019   CHOLHDL 4.3 10/06/2019   Lab Results  Component Value Date   NA 137 10/06/2019   K 4.5 10/06/2019   CREATININE 0.91 10/06/2019   GFRNONAA 88 10/06/2019   GFRAA 102 10/06/2019   GLUCOSE 98 10/06/2019     The 10-year ASCVD risk score Denman George DC Jr., et al., 2013) is: 18.7%   Lipid/Cholesterol, Follow-up  Last lipid panel Other pertinent labs  Lab Results  Component Value Date   CHOL 185 10/06/2019   HDL 43 10/06/2019   LDLCALC 117 (H) 10/06/2019   TRIG 138 10/06/2019   CHOLHDL 4.3 10/06/2019   Lab Results   Component Value Date   ALT 14 10/06/2019   AST 16 10/06/2019   PLT 251 10/06/2019   TSH 1.620 10/06/2019     He was last seen for this 1 months ago.  Management since that visit includes no medication changes.  He reports good compliance with treatment. He is not having side effects. none  Current diet: well balanced Current exercise: walking  The 10-year ASCVD risk score Denman George DC Jr., et al., 2013) is: 18.7%        Medications: Outpatient Medications Prior to Visit  Medication Sig   allopurinol (ZYLOPRIM) 100 MG tablet Take 1 tablet (100 mg total) by mouth daily.   carvedilol (COREG) 6.25 MG tablet Take 1 tablet (6.25 mg total) by mouth 2 (two) times daily with a meal.   Multiple Vitamin (MULTIVITAMIN) tablet Take 1 tablet by mouth daily. Fish oil   No facility-administered medications prior to visit.    Review of Systems  Constitutional:  Negative for activity change and fatigue.  Respiratory:  Negative for cough and shortness of breath.   Cardiovascular:  Negative for chest pain, palpitations and leg swelling.  Musculoskeletal:  Negative for arthralgias and myalgias.  Neurological:  Negative for dizziness, light-headedness and headaches.  Psychiatric/Behavioral:  Negative for agitation, self-injury and sleep disturbance. The patient is not nervous/anxious.  Objective    BP 139/88 (BP Location: Left Arm, Patient Position: Sitting, Cuff Size: Large)   Pulse 65   Resp 16   Ht 5\' 11"  (1.803 m)   Wt 206 lb (93.4 kg)   SpO2 97%   BMI 28.73 kg/m  BP Readings from Last 3 Encounters:  01/09/21 139/88  11/11/19 98/71  10/13/19 132/80   Wt Readings from Last 3 Encounters:  01/09/21 206 lb (93.4 kg)  11/11/19 196 lb (88.9 kg)  10/13/19 204 lb (92.5 kg)       Physical Exam Vitals reviewed.  HENT:     Head: Normocephalic and atraumatic.     Right Ear: External ear normal.     Left Ear: External ear normal.     Nose: Nose normal.  Eyes:     General:  No scleral icterus.    Conjunctiva/sclera: Conjunctivae normal.  Cardiovascular:     Rate and Rhythm: Normal rate and regular rhythm.  Pulmonary:     Effort: Pulmonary effort is normal.     Breath sounds: Normal breath sounds.  Abdominal:     Palpations: Abdomen is soft.  Lymphadenopathy:     Cervical: No cervical adenopathy.  Skin:    General: Skin is warm and dry.  Neurological:     General: No focal deficit present.     Mental Status: He is alert and oriented to person, place, and time.  Psychiatric:        Mood and Affect: Mood normal.        Behavior: Behavior normal.        Thought Content: Thought content normal.        Judgment: Judgment normal.      No results found for any visits on 01/09/21.  Assessment & Plan     1. Hypercholesteremia  - CBC w/Diff/Platelet - Comprehensive Metabolic Panel (CMET) - TSH - Lipid panel  2. Essential (primary) hypertension  - CBC w/Diff/Platelet - Comprehensive Metabolic Panel (CMET) - TSH - Lipid panel  3. Acute idiopathic gout of right foot  - Uric acid   Return in about 5 months (around 06/11/2021).      I, 08/09/2021, MD, have reviewed all documentation for this visit. The documentation on 01/14/21 for the exam, diagnosis, procedures, and orders are all accurate and complete.    Iktan Aikman 01/16/21, MD  [facility] [phone] (phone) [phone] (fax)  [facility]

## 2021-01-18 ENCOUNTER — Other Ambulatory Visit: Payer: Self-pay | Admitting: Family Medicine

## 2021-01-18 DIAGNOSIS — I10 Essential (primary) hypertension: Secondary | ICD-10-CM

## 2021-02-12 DIAGNOSIS — I10 Essential (primary) hypertension: Secondary | ICD-10-CM | POA: Diagnosis not present

## 2021-02-12 DIAGNOSIS — E7800 Pure hypercholesterolemia, unspecified: Secondary | ICD-10-CM | POA: Diagnosis not present

## 2021-02-12 DIAGNOSIS — M10071 Idiopathic gout, right ankle and foot: Secondary | ICD-10-CM | POA: Diagnosis not present

## 2021-02-13 ENCOUNTER — Encounter: Payer: Self-pay | Admitting: Family Medicine

## 2021-02-13 LAB — LIPID PANEL
Chol/HDL Ratio: 4.9 ratio (ref 0.0–5.0)
Cholesterol, Total: 227 mg/dL — ABNORMAL HIGH (ref 100–199)
HDL: 46 mg/dL (ref >39)
LDL Chol Calc (NIH): 145 mg/dL — ABNORMAL HIGH (ref 0–99)
Triglycerides: 199 mg/dL — ABNORMAL HIGH (ref 0–149)
VLDL Cholesterol Cal: 36 mg/dL (ref 5–40)

## 2021-02-13 LAB — COMPREHENSIVE METABOLIC PANEL
ALT: 15 IU/L (ref 0–44)
AST: 18 IU/L (ref 0–40)
Albumin/Globulin Ratio: 2 (ref 1.2–2.2)
Albumin: 4.5 g/dL (ref 3.8–4.8)
Alkaline Phosphatase: 85 IU/L (ref 44–121)
BUN/Creatinine Ratio: 13 (ref 10–24)
BUN: 10 mg/dL (ref 8–27)
Bilirubin Total: 0.5 mg/dL (ref 0.0–1.2)
CO2: 22 mmol/L (ref 20–29)
Calcium: 9.6 mg/dL (ref 8.6–10.2)
Chloride: 98 mmol/L (ref 96–106)
Creatinine, Ser: 0.79 mg/dL (ref 0.76–1.27)
Globulin, Total: 2.3 g/dL (ref 1.5–4.5)
Glucose: 95 mg/dL (ref 65–99)
Potassium: 4.8 mmol/L (ref 3.5–5.2)
Sodium: 136 mmol/L (ref 134–144)
Total Protein: 6.8 g/dL (ref 6.0–8.5)
eGFR: 98 mL/min/{1.73_m2} (ref >59)

## 2021-02-13 LAB — CBC WITH DIFFERENTIAL/PLATELET
Basophils Absolute: 0.1 10*3/uL (ref 0.0–0.2)
Basos: 1 %
EOS (ABSOLUTE): 0.1 10*3/uL (ref 0.0–0.4)
Eos: 1 %
Hematocrit: 46.2 % (ref 37.5–51.0)
Hemoglobin: 15.9 g/dL (ref 13.0–17.7)
Immature Grans (Abs): 0 10*3/uL (ref 0.0–0.1)
Immature Granulocytes: 0 %
Lymphocytes Absolute: 2.1 10*3/uL (ref 0.7–3.1)
Lymphs: 31 %
MCH: 30.6 pg (ref 26.6–33.0)
MCHC: 34.4 g/dL (ref 31.5–35.7)
MCV: 89 fL (ref 79–97)
Monocytes Absolute: 0.6 10*3/uL (ref 0.1–0.9)
Monocytes: 9 %
Neutrophils Absolute: 3.9 10*3/uL (ref 1.4–7.0)
Neutrophils: 58 %
Platelets: 275 10*3/uL (ref 150–450)
RBC: 5.19 x10E6/uL (ref 4.14–5.80)
RDW: 12.8 % (ref 11.6–15.4)
WBC: 6.8 10*3/uL (ref 3.4–10.8)

## 2021-02-13 LAB — URIC ACID: Uric Acid: 6.3 mg/dL (ref 3.8–8.4)

## 2021-02-13 LAB — TSH: TSH: 2.51 u[IU]/mL (ref 0.450–4.500)

## 2021-02-16 ENCOUNTER — Other Ambulatory Visit: Payer: Self-pay

## 2021-02-16 DIAGNOSIS — I10 Essential (primary) hypertension: Secondary | ICD-10-CM

## 2021-02-16 MED ORDER — CARVEDILOL 6.25 MG PO TABS: 6.2500 mg | ORAL_TABLET | Freq: Two times a day (BID) | ORAL | 3 refills | Status: AC

## 2021-02-21 ENCOUNTER — Ambulatory Visit (LOCAL_COMMUNITY_HEALTH_CENTER): Payer: Medicare Other

## 2021-02-21 ENCOUNTER — Other Ambulatory Visit: Payer: Self-pay

## 2021-02-21 DIAGNOSIS — Z23 Encounter for immunization: Secondary | ICD-10-CM

## 2021-02-21 NOTE — Progress Notes (Signed)
In Nurse Clinic for Tdap. Per Epic, last Tdap 2009. Tdap administered without problem. Updated [facility] copy given and explained. Jerel Shepherd, RN

## 2021-05-09 DIAGNOSIS — M9905 Segmental and somatic dysfunction of pelvic region: Secondary | ICD-10-CM | POA: Diagnosis not present

## 2021-05-09 DIAGNOSIS — M545 Low back pain, unspecified: Secondary | ICD-10-CM | POA: Diagnosis not present

## 2021-05-09 DIAGNOSIS — M5431 Sciatica, right side: Secondary | ICD-10-CM | POA: Diagnosis not present

## 2021-05-09 DIAGNOSIS — M9903 Segmental and somatic dysfunction of lumbar region: Secondary | ICD-10-CM | POA: Diagnosis not present

## 2021-05-11 DIAGNOSIS — M545 Low back pain, unspecified: Secondary | ICD-10-CM | POA: Diagnosis not present

## 2021-05-11 DIAGNOSIS — M9903 Segmental and somatic dysfunction of lumbar region: Secondary | ICD-10-CM | POA: Diagnosis not present

## 2021-05-11 DIAGNOSIS — M9905 Segmental and somatic dysfunction of pelvic region: Secondary | ICD-10-CM | POA: Diagnosis not present

## 2021-05-11 DIAGNOSIS — M5431 Sciatica, right side: Secondary | ICD-10-CM | POA: Diagnosis not present

## 2021-05-13 DIAGNOSIS — M5416 Radiculopathy, lumbar region: Secondary | ICD-10-CM | POA: Diagnosis not present

## 2021-05-18 DIAGNOSIS — M545 Low back pain, unspecified: Secondary | ICD-10-CM | POA: Diagnosis not present

## 2021-05-18 DIAGNOSIS — M5431 Sciatica, right side: Secondary | ICD-10-CM | POA: Diagnosis not present

## 2021-05-18 DIAGNOSIS — M9903 Segmental and somatic dysfunction of lumbar region: Secondary | ICD-10-CM | POA: Diagnosis not present

## 2021-05-18 DIAGNOSIS — M9905 Segmental and somatic dysfunction of pelvic region: Secondary | ICD-10-CM | POA: Diagnosis not present

## 2021-05-21 DIAGNOSIS — M545 Low back pain, unspecified: Secondary | ICD-10-CM | POA: Diagnosis not present

## 2021-05-21 DIAGNOSIS — M9903 Segmental and somatic dysfunction of lumbar region: Secondary | ICD-10-CM | POA: Diagnosis not present

## 2021-05-21 DIAGNOSIS — M5431 Sciatica, right side: Secondary | ICD-10-CM | POA: Diagnosis not present

## 2021-05-21 DIAGNOSIS — M9905 Segmental and somatic dysfunction of pelvic region: Secondary | ICD-10-CM | POA: Diagnosis not present

## 2021-05-23 DIAGNOSIS — M545 Low back pain, unspecified: Secondary | ICD-10-CM | POA: Diagnosis not present

## 2021-05-23 DIAGNOSIS — M9903 Segmental and somatic dysfunction of lumbar region: Secondary | ICD-10-CM | POA: Diagnosis not present

## 2021-05-23 DIAGNOSIS — M5431 Sciatica, right side: Secondary | ICD-10-CM | POA: Diagnosis not present

## 2021-05-23 DIAGNOSIS — M9905 Segmental and somatic dysfunction of pelvic region: Secondary | ICD-10-CM | POA: Diagnosis not present

## 2021-05-25 DIAGNOSIS — M9903 Segmental and somatic dysfunction of lumbar region: Secondary | ICD-10-CM | POA: Diagnosis not present

## 2021-05-25 DIAGNOSIS — M5431 Sciatica, right side: Secondary | ICD-10-CM | POA: Diagnosis not present

## 2021-05-25 DIAGNOSIS — M545 Low back pain, unspecified: Secondary | ICD-10-CM | POA: Diagnosis not present

## 2021-05-25 DIAGNOSIS — M9905 Segmental and somatic dysfunction of pelvic region: Secondary | ICD-10-CM | POA: Diagnosis not present

## 2021-05-29 DIAGNOSIS — M5431 Sciatica, right side: Secondary | ICD-10-CM | POA: Diagnosis not present

## 2021-05-29 DIAGNOSIS — M9903 Segmental and somatic dysfunction of lumbar region: Secondary | ICD-10-CM | POA: Diagnosis not present

## 2021-05-29 DIAGNOSIS — M9905 Segmental and somatic dysfunction of pelvic region: Secondary | ICD-10-CM | POA: Diagnosis not present

## 2021-05-29 DIAGNOSIS — M545 Low back pain, unspecified: Secondary | ICD-10-CM | POA: Diagnosis not present

## 2021-05-30 DIAGNOSIS — M545 Low back pain, unspecified: Secondary | ICD-10-CM | POA: Diagnosis not present

## 2021-05-30 DIAGNOSIS — M5431 Sciatica, right side: Secondary | ICD-10-CM | POA: Diagnosis not present

## 2021-05-30 DIAGNOSIS — M9903 Segmental and somatic dysfunction of lumbar region: Secondary | ICD-10-CM | POA: Diagnosis not present

## 2021-05-30 DIAGNOSIS — M9905 Segmental and somatic dysfunction of pelvic region: Secondary | ICD-10-CM | POA: Diagnosis not present

## 2021-06-05 DIAGNOSIS — M9905 Segmental and somatic dysfunction of pelvic region: Secondary | ICD-10-CM | POA: Diagnosis not present

## 2021-06-05 DIAGNOSIS — M545 Low back pain, unspecified: Secondary | ICD-10-CM | POA: Diagnosis not present

## 2021-06-05 DIAGNOSIS — M9903 Segmental and somatic dysfunction of lumbar region: Secondary | ICD-10-CM | POA: Diagnosis not present

## 2021-06-05 DIAGNOSIS — M5431 Sciatica, right side: Secondary | ICD-10-CM | POA: Diagnosis not present

## 2021-06-11 ENCOUNTER — Ambulatory Visit: Payer: Self-pay | Admitting: Family Medicine

## 2021-06-26 DIAGNOSIS — M9903 Segmental and somatic dysfunction of lumbar region: Secondary | ICD-10-CM | POA: Diagnosis not present

## 2021-06-26 DIAGNOSIS — M545 Low back pain, unspecified: Secondary | ICD-10-CM | POA: Diagnosis not present

## 2021-06-26 DIAGNOSIS — M5431 Sciatica, right side: Secondary | ICD-10-CM | POA: Diagnosis not present

## 2021-06-26 DIAGNOSIS — M9905 Segmental and somatic dysfunction of pelvic region: Secondary | ICD-10-CM | POA: Diagnosis not present

## 2021-07-10 DIAGNOSIS — M9903 Segmental and somatic dysfunction of lumbar region: Secondary | ICD-10-CM | POA: Diagnosis not present

## 2021-07-10 DIAGNOSIS — M5431 Sciatica, right side: Secondary | ICD-10-CM | POA: Diagnosis not present

## 2021-07-10 DIAGNOSIS — M9905 Segmental and somatic dysfunction of pelvic region: Secondary | ICD-10-CM | POA: Diagnosis not present

## 2021-07-10 DIAGNOSIS — M545 Low back pain, unspecified: Secondary | ICD-10-CM | POA: Diagnosis not present

## 2021-07-24 DIAGNOSIS — M9903 Segmental and somatic dysfunction of lumbar region: Secondary | ICD-10-CM | POA: Diagnosis not present

## 2021-07-24 DIAGNOSIS — M9905 Segmental and somatic dysfunction of pelvic region: Secondary | ICD-10-CM | POA: Diagnosis not present

## 2021-07-24 DIAGNOSIS — M5431 Sciatica, right side: Secondary | ICD-10-CM | POA: Diagnosis not present

## 2021-07-24 DIAGNOSIS — M545 Low back pain, unspecified: Secondary | ICD-10-CM | POA: Diagnosis not present

## 2021-07-25 DIAGNOSIS — Z79899 Other long term (current) drug therapy: Secondary | ICD-10-CM | POA: Diagnosis not present

## 2021-07-25 DIAGNOSIS — I10 Essential (primary) hypertension: Secondary | ICD-10-CM | POA: Diagnosis not present

## 2021-07-25 DIAGNOSIS — Z125 Encounter for screening for malignant neoplasm of prostate: Secondary | ICD-10-CM | POA: Diagnosis not present

## 2021-07-25 DIAGNOSIS — M1A00X0 Idiopathic chronic gout, unspecified site, without tophus (tophi): Secondary | ICD-10-CM | POA: Diagnosis not present

## 2021-07-25 DIAGNOSIS — E782 Mixed hyperlipidemia: Secondary | ICD-10-CM | POA: Diagnosis not present

## 2021-07-25 DIAGNOSIS — Z0000 Encounter for general adult medical examination without abnormal findings: Secondary | ICD-10-CM | POA: Diagnosis not present

## 2021-08-08 DIAGNOSIS — M9905 Segmental and somatic dysfunction of pelvic region: Secondary | ICD-10-CM | POA: Diagnosis not present

## 2021-08-08 DIAGNOSIS — M5431 Sciatica, right side: Secondary | ICD-10-CM | POA: Diagnosis not present

## 2021-08-08 DIAGNOSIS — E782 Mixed hyperlipidemia: Secondary | ICD-10-CM | POA: Diagnosis not present

## 2021-08-08 DIAGNOSIS — Z125 Encounter for screening for malignant neoplasm of prostate: Secondary | ICD-10-CM | POA: Diagnosis not present

## 2021-08-08 DIAGNOSIS — Z79899 Other long term (current) drug therapy: Secondary | ICD-10-CM | POA: Diagnosis not present

## 2021-08-08 DIAGNOSIS — I10 Essential (primary) hypertension: Secondary | ICD-10-CM | POA: Diagnosis not present

## 2021-08-08 DIAGNOSIS — M1A00X0 Idiopathic chronic gout, unspecified site, without tophus (tophi): Secondary | ICD-10-CM | POA: Diagnosis not present

## 2021-08-08 DIAGNOSIS — M545 Low back pain, unspecified: Secondary | ICD-10-CM | POA: Diagnosis not present

## 2021-08-08 DIAGNOSIS — M9903 Segmental and somatic dysfunction of lumbar region: Secondary | ICD-10-CM | POA: Diagnosis not present

## 2021-09-05 DIAGNOSIS — M5431 Sciatica, right side: Secondary | ICD-10-CM | POA: Diagnosis not present

## 2021-09-05 DIAGNOSIS — M545 Low back pain, unspecified: Secondary | ICD-10-CM | POA: Diagnosis not present

## 2021-09-05 DIAGNOSIS — M9903 Segmental and somatic dysfunction of lumbar region: Secondary | ICD-10-CM | POA: Diagnosis not present

## 2021-09-05 DIAGNOSIS — M9905 Segmental and somatic dysfunction of pelvic region: Secondary | ICD-10-CM | POA: Diagnosis not present

## 2021-10-03 DIAGNOSIS — M9905 Segmental and somatic dysfunction of pelvic region: Secondary | ICD-10-CM | POA: Diagnosis not present

## 2021-10-03 DIAGNOSIS — M9903 Segmental and somatic dysfunction of lumbar region: Secondary | ICD-10-CM | POA: Diagnosis not present

## 2021-10-03 DIAGNOSIS — M5431 Sciatica, right side: Secondary | ICD-10-CM | POA: Diagnosis not present

## 2021-10-03 DIAGNOSIS — M545 Low back pain, unspecified: Secondary | ICD-10-CM | POA: Diagnosis not present

## 2021-10-11 DIAGNOSIS — H5213 Myopia, bilateral: Secondary | ICD-10-CM | POA: Diagnosis not present

## 2021-10-11 DIAGNOSIS — Z0100 Encounter for examination of eyes and vision without abnormal findings: Secondary | ICD-10-CM | POA: Diagnosis not present

## 2021-10-15 ENCOUNTER — Ambulatory Visit: Payer: Self-pay | Admitting: Family Medicine

## 2021-11-07 DIAGNOSIS — M9903 Segmental and somatic dysfunction of lumbar region: Secondary | ICD-10-CM | POA: Diagnosis not present

## 2021-11-07 DIAGNOSIS — M545 Low back pain, unspecified: Secondary | ICD-10-CM | POA: Diagnosis not present

## 2021-11-07 DIAGNOSIS — E785 Hyperlipidemia, unspecified: Secondary | ICD-10-CM | POA: Diagnosis not present

## 2021-11-07 DIAGNOSIS — M5431 Sciatica, right side: Secondary | ICD-10-CM | POA: Diagnosis not present

## 2021-11-07 DIAGNOSIS — M9905 Segmental and somatic dysfunction of pelvic region: Secondary | ICD-10-CM | POA: Diagnosis not present

## 2021-12-19 DIAGNOSIS — M9905 Segmental and somatic dysfunction of pelvic region: Secondary | ICD-10-CM | POA: Diagnosis not present

## 2021-12-19 DIAGNOSIS — M5431 Sciatica, right side: Secondary | ICD-10-CM | POA: Diagnosis not present

## 2021-12-19 DIAGNOSIS — M545 Low back pain, unspecified: Secondary | ICD-10-CM | POA: Diagnosis not present

## 2021-12-19 DIAGNOSIS — M9903 Segmental and somatic dysfunction of lumbar region: Secondary | ICD-10-CM | POA: Diagnosis not present

## 2022-01-16 DIAGNOSIS — M5431 Sciatica, right side: Secondary | ICD-10-CM | POA: Diagnosis not present

## 2022-01-16 DIAGNOSIS — M9905 Segmental and somatic dysfunction of pelvic region: Secondary | ICD-10-CM | POA: Diagnosis not present

## 2022-01-16 DIAGNOSIS — M545 Low back pain, unspecified: Secondary | ICD-10-CM | POA: Diagnosis not present

## 2022-01-16 DIAGNOSIS — M9903 Segmental and somatic dysfunction of lumbar region: Secondary | ICD-10-CM | POA: Diagnosis not present

## 2022-01-23 DIAGNOSIS — M1A00X0 Idiopathic chronic gout, unspecified site, without tophus (tophi): Secondary | ICD-10-CM | POA: Diagnosis not present

## 2022-01-23 DIAGNOSIS — Z79899 Other long term (current) drug therapy: Secondary | ICD-10-CM | POA: Diagnosis not present

## 2022-01-23 DIAGNOSIS — I10 Essential (primary) hypertension: Secondary | ICD-10-CM | POA: Diagnosis not present

## 2022-01-23 DIAGNOSIS — Z125 Encounter for screening for malignant neoplasm of prostate: Secondary | ICD-10-CM | POA: Diagnosis not present

## 2022-01-23 DIAGNOSIS — E782 Mixed hyperlipidemia: Secondary | ICD-10-CM | POA: Diagnosis not present

## 2022-02-13 DIAGNOSIS — M9903 Segmental and somatic dysfunction of lumbar region: Secondary | ICD-10-CM | POA: Diagnosis not present

## 2022-02-13 DIAGNOSIS — M5431 Sciatica, right side: Secondary | ICD-10-CM | POA: Diagnosis not present

## 2022-02-13 DIAGNOSIS — M9905 Segmental and somatic dysfunction of pelvic region: Secondary | ICD-10-CM | POA: Diagnosis not present

## 2022-02-13 DIAGNOSIS — M545 Low back pain, unspecified: Secondary | ICD-10-CM | POA: Diagnosis not present

## 2022-03-01 DIAGNOSIS — E782 Mixed hyperlipidemia: Secondary | ICD-10-CM | POA: Diagnosis not present

## 2022-03-01 DIAGNOSIS — Z125 Encounter for screening for malignant neoplasm of prostate: Secondary | ICD-10-CM | POA: Diagnosis not present

## 2022-03-01 DIAGNOSIS — Z79899 Other long term (current) drug therapy: Secondary | ICD-10-CM | POA: Diagnosis not present

## 2022-03-06 DIAGNOSIS — M9903 Segmental and somatic dysfunction of lumbar region: Secondary | ICD-10-CM | POA: Diagnosis not present

## 2022-03-06 DIAGNOSIS — M9905 Segmental and somatic dysfunction of pelvic region: Secondary | ICD-10-CM | POA: Diagnosis not present

## 2022-03-06 DIAGNOSIS — M5431 Sciatica, right side: Secondary | ICD-10-CM | POA: Diagnosis not present

## 2022-03-06 DIAGNOSIS — M545 Low back pain, unspecified: Secondary | ICD-10-CM | POA: Diagnosis not present

## 2022-03-27 DIAGNOSIS — M9903 Segmental and somatic dysfunction of lumbar region: Secondary | ICD-10-CM | POA: Diagnosis not present

## 2022-03-27 DIAGNOSIS — M545 Low back pain, unspecified: Secondary | ICD-10-CM | POA: Diagnosis not present

## 2022-03-27 DIAGNOSIS — M5431 Sciatica, right side: Secondary | ICD-10-CM | POA: Diagnosis not present

## 2022-03-27 DIAGNOSIS — M9905 Segmental and somatic dysfunction of pelvic region: Secondary | ICD-10-CM | POA: Diagnosis not present

## 2022-04-17 DIAGNOSIS — M5431 Sciatica, right side: Secondary | ICD-10-CM | POA: Diagnosis not present

## 2022-04-17 DIAGNOSIS — M545 Low back pain, unspecified: Secondary | ICD-10-CM | POA: Diagnosis not present

## 2022-04-17 DIAGNOSIS — M9905 Segmental and somatic dysfunction of pelvic region: Secondary | ICD-10-CM | POA: Diagnosis not present

## 2022-04-17 DIAGNOSIS — M9903 Segmental and somatic dysfunction of lumbar region: Secondary | ICD-10-CM | POA: Diagnosis not present

## 2022-05-08 DIAGNOSIS — M545 Low back pain, unspecified: Secondary | ICD-10-CM | POA: Diagnosis not present

## 2022-05-08 DIAGNOSIS — M9905 Segmental and somatic dysfunction of pelvic region: Secondary | ICD-10-CM | POA: Diagnosis not present

## 2022-05-08 DIAGNOSIS — M5431 Sciatica, right side: Secondary | ICD-10-CM | POA: Diagnosis not present

## 2022-05-08 DIAGNOSIS — M9903 Segmental and somatic dysfunction of lumbar region: Secondary | ICD-10-CM | POA: Diagnosis not present

## 2022-06-05 DIAGNOSIS — M5431 Sciatica, right side: Secondary | ICD-10-CM | POA: Diagnosis not present

## 2022-06-05 DIAGNOSIS — M9903 Segmental and somatic dysfunction of lumbar region: Secondary | ICD-10-CM | POA: Diagnosis not present

## 2022-06-05 DIAGNOSIS — M545 Low back pain, unspecified: Secondary | ICD-10-CM | POA: Diagnosis not present

## 2022-06-05 DIAGNOSIS — M9905 Segmental and somatic dysfunction of pelvic region: Secondary | ICD-10-CM | POA: Diagnosis not present

## 2022-07-03 DIAGNOSIS — M9905 Segmental and somatic dysfunction of pelvic region: Secondary | ICD-10-CM | POA: Diagnosis not present

## 2022-07-03 DIAGNOSIS — M545 Low back pain, unspecified: Secondary | ICD-10-CM | POA: Diagnosis not present

## 2022-07-03 DIAGNOSIS — M5431 Sciatica, right side: Secondary | ICD-10-CM | POA: Diagnosis not present

## 2022-07-03 DIAGNOSIS — M9903 Segmental and somatic dysfunction of lumbar region: Secondary | ICD-10-CM | POA: Diagnosis not present

## 2022-07-29 DIAGNOSIS — M9905 Segmental and somatic dysfunction of pelvic region: Secondary | ICD-10-CM | POA: Diagnosis not present

## 2022-07-29 DIAGNOSIS — M9903 Segmental and somatic dysfunction of lumbar region: Secondary | ICD-10-CM | POA: Diagnosis not present

## 2022-07-29 DIAGNOSIS — M545 Low back pain, unspecified: Secondary | ICD-10-CM | POA: Diagnosis not present

## 2022-07-29 DIAGNOSIS — M5431 Sciatica, right side: Secondary | ICD-10-CM | POA: Diagnosis not present

## 2022-07-31 DIAGNOSIS — Z125 Encounter for screening for malignant neoplasm of prostate: Secondary | ICD-10-CM | POA: Diagnosis not present

## 2022-07-31 DIAGNOSIS — Z0000 Encounter for general adult medical examination without abnormal findings: Secondary | ICD-10-CM | POA: Diagnosis not present

## 2022-07-31 DIAGNOSIS — R002 Palpitations: Secondary | ICD-10-CM | POA: Diagnosis not present

## 2022-07-31 DIAGNOSIS — Z79899 Other long term (current) drug therapy: Secondary | ICD-10-CM | POA: Diagnosis not present

## 2022-07-31 DIAGNOSIS — I10 Essential (primary) hypertension: Secondary | ICD-10-CM | POA: Diagnosis not present

## 2022-07-31 DIAGNOSIS — E782 Mixed hyperlipidemia: Secondary | ICD-10-CM | POA: Diagnosis not present

## 2022-07-31 DIAGNOSIS — M1A00X0 Idiopathic chronic gout, unspecified site, without tophus (tophi): Secondary | ICD-10-CM | POA: Diagnosis not present

## 2022-08-26 DIAGNOSIS — M9905 Segmental and somatic dysfunction of pelvic region: Secondary | ICD-10-CM | POA: Diagnosis not present

## 2022-08-26 DIAGNOSIS — M9903 Segmental and somatic dysfunction of lumbar region: Secondary | ICD-10-CM | POA: Diagnosis not present

## 2022-08-26 DIAGNOSIS — M5431 Sciatica, right side: Secondary | ICD-10-CM | POA: Diagnosis not present

## 2022-08-26 DIAGNOSIS — M545 Low back pain, unspecified: Secondary | ICD-10-CM | POA: Diagnosis not present

## 2022-08-29 DIAGNOSIS — Z125 Encounter for screening for malignant neoplasm of prostate: Secondary | ICD-10-CM | POA: Diagnosis not present

## 2022-09-18 DIAGNOSIS — M9903 Segmental and somatic dysfunction of lumbar region: Secondary | ICD-10-CM | POA: Diagnosis not present

## 2022-09-18 DIAGNOSIS — M9905 Segmental and somatic dysfunction of pelvic region: Secondary | ICD-10-CM | POA: Diagnosis not present

## 2022-09-18 DIAGNOSIS — M545 Low back pain, unspecified: Secondary | ICD-10-CM | POA: Diagnosis not present

## 2022-09-18 DIAGNOSIS — M5431 Sciatica, right side: Secondary | ICD-10-CM | POA: Diagnosis not present

## 2022-09-25 DIAGNOSIS — M545 Low back pain, unspecified: Secondary | ICD-10-CM | POA: Diagnosis not present

## 2022-09-25 DIAGNOSIS — I10 Essential (primary) hypertension: Secondary | ICD-10-CM | POA: Diagnosis not present

## 2022-09-25 DIAGNOSIS — M5431 Sciatica, right side: Secondary | ICD-10-CM | POA: Diagnosis not present

## 2022-09-25 DIAGNOSIS — R002 Palpitations: Secondary | ICD-10-CM | POA: Diagnosis not present

## 2022-09-25 DIAGNOSIS — M9903 Segmental and somatic dysfunction of lumbar region: Secondary | ICD-10-CM | POA: Diagnosis not present

## 2022-09-25 DIAGNOSIS — M9905 Segmental and somatic dysfunction of pelvic region: Secondary | ICD-10-CM | POA: Diagnosis not present

## 2022-10-02 DIAGNOSIS — M9905 Segmental and somatic dysfunction of pelvic region: Secondary | ICD-10-CM | POA: Diagnosis not present

## 2022-10-02 DIAGNOSIS — M9903 Segmental and somatic dysfunction of lumbar region: Secondary | ICD-10-CM | POA: Diagnosis not present

## 2022-10-02 DIAGNOSIS — M5431 Sciatica, right side: Secondary | ICD-10-CM | POA: Diagnosis not present

## 2022-10-02 DIAGNOSIS — M545 Low back pain, unspecified: Secondary | ICD-10-CM | POA: Diagnosis not present

## 2022-10-16 DIAGNOSIS — M9903 Segmental and somatic dysfunction of lumbar region: Secondary | ICD-10-CM | POA: Diagnosis not present

## 2022-10-16 DIAGNOSIS — M5431 Sciatica, right side: Secondary | ICD-10-CM | POA: Diagnosis not present

## 2022-10-16 DIAGNOSIS — M9905 Segmental and somatic dysfunction of pelvic region: Secondary | ICD-10-CM | POA: Diagnosis not present

## 2022-10-16 DIAGNOSIS — M545 Low back pain, unspecified: Secondary | ICD-10-CM | POA: Diagnosis not present

## 2022-10-23 DIAGNOSIS — M5431 Sciatica, right side: Secondary | ICD-10-CM | POA: Diagnosis not present

## 2022-10-23 DIAGNOSIS — M9905 Segmental and somatic dysfunction of pelvic region: Secondary | ICD-10-CM | POA: Diagnosis not present

## 2022-10-23 DIAGNOSIS — M9903 Segmental and somatic dysfunction of lumbar region: Secondary | ICD-10-CM | POA: Diagnosis not present

## 2022-10-23 DIAGNOSIS — M545 Low back pain, unspecified: Secondary | ICD-10-CM | POA: Diagnosis not present

## 2022-10-30 DIAGNOSIS — M545 Low back pain, unspecified: Secondary | ICD-10-CM | POA: Diagnosis not present

## 2022-10-30 DIAGNOSIS — M9905 Segmental and somatic dysfunction of pelvic region: Secondary | ICD-10-CM | POA: Diagnosis not present

## 2022-10-30 DIAGNOSIS — M5431 Sciatica, right side: Secondary | ICD-10-CM | POA: Diagnosis not present

## 2022-10-30 DIAGNOSIS — M9903 Segmental and somatic dysfunction of lumbar region: Secondary | ICD-10-CM | POA: Diagnosis not present

## 2022-11-06 DIAGNOSIS — M9903 Segmental and somatic dysfunction of lumbar region: Secondary | ICD-10-CM | POA: Diagnosis not present

## 2022-11-06 DIAGNOSIS — M9905 Segmental and somatic dysfunction of pelvic region: Secondary | ICD-10-CM | POA: Diagnosis not present

## 2022-11-06 DIAGNOSIS — M5431 Sciatica, right side: Secondary | ICD-10-CM | POA: Diagnosis not present

## 2022-11-06 DIAGNOSIS — M545 Low back pain, unspecified: Secondary | ICD-10-CM | POA: Diagnosis not present

## 2022-12-04 DIAGNOSIS — M5431 Sciatica, right side: Secondary | ICD-10-CM | POA: Diagnosis not present

## 2022-12-04 DIAGNOSIS — M545 Low back pain, unspecified: Secondary | ICD-10-CM | POA: Diagnosis not present

## 2022-12-04 DIAGNOSIS — M9903 Segmental and somatic dysfunction of lumbar region: Secondary | ICD-10-CM | POA: Diagnosis not present

## 2022-12-04 DIAGNOSIS — M9905 Segmental and somatic dysfunction of pelvic region: Secondary | ICD-10-CM | POA: Diagnosis not present

## 2023-01-01 DIAGNOSIS — M5431 Sciatica, right side: Secondary | ICD-10-CM | POA: Diagnosis not present

## 2023-01-01 DIAGNOSIS — M9905 Segmental and somatic dysfunction of pelvic region: Secondary | ICD-10-CM | POA: Diagnosis not present

## 2023-01-01 DIAGNOSIS — M545 Low back pain, unspecified: Secondary | ICD-10-CM | POA: Diagnosis not present

## 2023-01-01 DIAGNOSIS — M9903 Segmental and somatic dysfunction of lumbar region: Secondary | ICD-10-CM | POA: Diagnosis not present

## 2023-01-22 DIAGNOSIS — M9903 Segmental and somatic dysfunction of lumbar region: Secondary | ICD-10-CM | POA: Diagnosis not present

## 2023-01-22 DIAGNOSIS — M545 Low back pain, unspecified: Secondary | ICD-10-CM | POA: Diagnosis not present

## 2023-01-22 DIAGNOSIS — M5431 Sciatica, right side: Secondary | ICD-10-CM | POA: Diagnosis not present

## 2023-01-22 DIAGNOSIS — M9905 Segmental and somatic dysfunction of pelvic region: Secondary | ICD-10-CM | POA: Diagnosis not present

## 2023-02-05 DIAGNOSIS — Z125 Encounter for screening for malignant neoplasm of prostate: Secondary | ICD-10-CM | POA: Diagnosis not present

## 2023-02-05 DIAGNOSIS — Z79899 Other long term (current) drug therapy: Secondary | ICD-10-CM | POA: Diagnosis not present

## 2023-02-05 DIAGNOSIS — M1A00X0 Idiopathic chronic gout, unspecified site, without tophus (tophi): Secondary | ICD-10-CM | POA: Diagnosis not present

## 2023-02-05 DIAGNOSIS — I10 Essential (primary) hypertension: Secondary | ICD-10-CM | POA: Diagnosis not present

## 2023-02-05 DIAGNOSIS — R972 Elevated prostate specific antigen [PSA]: Secondary | ICD-10-CM | POA: Diagnosis not present

## 2023-02-05 DIAGNOSIS — E782 Mixed hyperlipidemia: Secondary | ICD-10-CM | POA: Diagnosis not present

## 2023-02-12 DIAGNOSIS — M545 Low back pain, unspecified: Secondary | ICD-10-CM | POA: Diagnosis not present

## 2023-02-12 DIAGNOSIS — M5431 Sciatica, right side: Secondary | ICD-10-CM | POA: Diagnosis not present

## 2023-02-12 DIAGNOSIS — M9905 Segmental and somatic dysfunction of pelvic region: Secondary | ICD-10-CM | POA: Diagnosis not present

## 2023-02-12 DIAGNOSIS — M9903 Segmental and somatic dysfunction of lumbar region: Secondary | ICD-10-CM | POA: Diagnosis not present

## 2023-03-05 DIAGNOSIS — K4091 Unilateral inguinal hernia, without obstruction or gangrene, recurrent: Secondary | ICD-10-CM | POA: Diagnosis not present

## 2023-03-11 ENCOUNTER — Ambulatory Visit: Payer: Self-pay | Admitting: General Surgery

## 2023-03-11 DIAGNOSIS — K4090 Unilateral inguinal hernia, without obstruction or gangrene, not specified as recurrent: Secondary | ICD-10-CM | POA: Diagnosis not present

## 2023-03-11 NOTE — H&P (View-Only) (Signed)
PATIENT PROFILE: Eduardo Perez is a 67 y.o. male who presents to the Clinic for consultation at the request of  [place], [place] for evaluation of inguinal hernia.  PCP:  Marguarite Arbour, MD  HISTORY OF PRESENT ILLNESS: Mr. Eduardo Perez reports he first felt a bulge in the right groin 59-month ago.  He endorses that he was playing golf and he felt bulge of the size of a golf ball.  It got pretty painful and he was unable to keep playing.  He went home and laid down and was able to reduce the bulge.  Since then he has been having pain in the right groin.  No pain radiation.  Pain aggravated with certain activities.  No alleviating factors.  Patient denies any episode of abdominal distention, nausea or vomiting.   PROBLEM LIST: Problem List  Date Reviewed: 02/05/2023          Noted   HTN, goal below 140/90 07/25/2021   Mixed hyperlipidemia 07/25/2021   Idiopathic chronic gout 07/25/2021    GENERAL REVIEW OF SYSTEMS:   General ROS: negative for - chills, fatigue, fever, weight gain or weight loss Allergy and Immunology ROS: negative for - hives  Hematological and Lymphatic ROS: negative for - bleeding problems or bruising, negative for palpable nodes Endocrine ROS: negative for - heat or cold intolerance, hair changes Respiratory ROS: negative for - cough, shortness of breath or wheezing Cardiovascular ROS: no chest pain or palpitations GI ROS: negative for nausea, vomiting, abdominal pain, diarrhea, constipation Musculoskeletal ROS: negative for - joint swelling or muscle pain Neurological ROS: negative for - confusion, syncope Dermatological ROS: negative for pruritus and rash Psychiatric: negative for anxiety, depression, difficulty sleeping and memory loss  MEDICATIONS: Current Outpatient Medications  Medication Sig Dispense Refill   allopurinoL (ZYLOPRIM) 100 MG tablet Take 1 tablet (100 mg total) by mouth once daily 90 tablet 3   carvediloL (COREG) 6.25 MG tablet Take 1 tablet (6.25 mg total) by  mouth 2 (two) times daily with meals 180 tablet 3   magnesium oxide (MAG-OX) 400 mg (241.3 mg magnesium) tablet Take 400 mg by mouth once daily     multivitamin tablet Take 1 tablet by mouth once daily     omega 3-dha-epa-fish oil (FISH OIL) 1,000 mg (120 mg-180 mg) Cap Take by mouth     tadalafiL (CIALIS) 5 MG tablet Take 1 tablet (5 mg total) by mouth once daily 90 tablet 3   No current facility-administered medications for this visit.    ALLERGIES: Patient has no known allergies.  PAST MEDICAL HISTORY: Past Medical History:  Diagnosis Date   Gout    History of blood transfusion 07/2008   Bleeding ulcer   Tachycardia     PAST SURGICAL HISTORY: History reviewed. No pertinent surgical history.   FAMILY HISTORY: No family history on file.   SOCIAL HISTORY: Social History   Socioeconomic History   Marital status: Married  Tobacco Use   Smoking status: Former    Current packs/day: 0.00    Average packs/day: 1 pack/day for 22.0 years (22.0 ttl pk-yrs)    Types: Cigarettes    Start date: 07/28/1976    Quit date: 07/28/1998    Years since quitting: 24.6   Smokeless tobacco: Never  Vaping Use   Vaping status: Never Used  Substance and Sexual Activity   Alcohol use: Yes    Alcohol/week: 1.0 standard drink of alcohol    Types: 1 Standard drinks or equivalent per week  Comment: Patient reports he drinks about 2-3 months   Drug use: Not Currently   Sexual activity: Yes    Partners: Female    PHYSICAL EXAM: Vitals:   03/11/23 1443  BP: (!) 147/85  Pulse: 69   Body mass index is 28.55 kg/m. Weight: 90.3 kg (199 lb)   GENERAL: Alert, active, oriented x3  HEENT: Pupils equal reactive to light. Extraocular movements are intact. Sclera clear. Palpebral conjunctiva normal red color.Pharynx clear.  NECK: Supple with no palpable mass and no adenopathy.  LUNGS: Sound clear with no rales rhonchi or wheezes.  HEART: Regular rhythm S1 and S2 without murmur.  ABDOMEN:  Soft and depressible, nontender with no palpable mass, no hepatomegaly.  Right groin hernia, reducible.  Weakness of the left groin with Valsalba.  EXTREMITIES: Well-developed well-nourished symmetrical with no dependent edema.  NEUROLOGICAL: Awake alert oriented, facial expression symmetrical, moving all extremities.  REVIEW OF DATA: I have reviewed the following data today: Office Visit on 02/05/2023  Component Date Value   Cholesterol, Total 02/05/2023 196    Triglyceride 02/05/2023 118    HDL (High Density Lipopr* 41/32/4401 45.4    LDL Calculated 02/05/2023 027    VLDL Cholesterol 02/05/2023 24    Cholesterol/HDL Ratio 02/05/2023 4.3    WBC (White Blood Cell Co* 02/05/2023 5.9    RBC (Red Blood Cell Coun* 02/05/2023 4.87    Hemoglobin 02/05/2023 15.6    Hematocrit 02/05/2023 43.4    MCV (Mean Corpuscular Vo* 02/05/2023 89.1    MCH (Mean Corpuscular He* 02/05/2023 32.0 (H)    MCHC (Mean Corpuscular H* 02/05/2023 35.9    Platelet Count 02/05/2023 253    RDW-CV (Red Cell Distrib* 02/05/2023 12.9    MPV (Mean Platelet Volum* 02/05/2023 8.8 (L)    Neutrophils 02/05/2023 3.83    Lymphocytes 02/05/2023 1.44    Monocytes 02/05/2023 0.51    Eosinophils 02/05/2023 0.10    Basophils 02/05/2023 0.04    Neutrophil % 02/05/2023 64.5    Lymphocyte % 02/05/2023 24.3    Monocyte % 02/05/2023 8.6    Eosinophil % 02/05/2023 1.7    Basophil% 02/05/2023 0.7    Immature Granulocyte % 02/05/2023 0.2    Immature Granulocyte Cou* 02/05/2023 0.01    Glucose 02/05/2023 96    Sodium 02/05/2023 138    Potassium 02/05/2023 4.4    Chloride 02/05/2023 102    Carbon Dioxide (CO2) 02/05/2023 32.5 (H)    Urea Nitrogen (BUN) 02/05/2023 11    Creatinine 02/05/2023 0.8    Glomerular Filtration Ra* 02/05/2023 96    Calcium 02/05/2023 9.5    AST  02/05/2023 16    ALT  02/05/2023 17    Alk Phos (alkaline Phosp* 02/05/2023 65    Albumin 02/05/2023 4.2    Bilirubin, Total 02/05/2023 0.8    Protein,  Total 02/05/2023 6.7    A/G Ratio 02/05/2023 1.7    PSA (Prostate Specific A* 02/05/2023 4.08 (H)    Color 02/05/2023 Light Yellow    Clarity 02/05/2023 Clear    Specific Gravity 02/05/2023 1.012    pH, Urine 02/05/2023 7.0    Protein, Urinalysis 02/05/2023 Negative    Glucose, Urinalysis 02/05/2023 Negative    Ketones, Urinalysis 02/05/2023 Negative    Blood, Urinalysis 02/05/2023 Negative    Nitrite, Urinalysis 02/05/2023 Negative    Leukocyte Esterase, Urin* 02/05/2023 Negative    Bilirubin, Urinalysis 02/05/2023 Negative    Urobilinogen, Urinalysis 02/05/2023 0.2    WBC, UA 02/05/2023 <1    Red Blood  Cells, Urinaly* 02/05/2023 1    Bacteria, Urinalysis 02/05/2023 0-5    Squamous Epithelial Cell* 02/05/2023 0    Uric Acid 02/05/2023 6.7      ASSESSMENT: Mr. Buchberger is a 67 y.o. male presenting for consultation for right versus bilateral inguinal hernia.    The patient presents with a symptomatic, reducible right versus bilateral inguinal hernia. Patient was oriented about the diagnosis of inguinal hernia and its implication. The patient was oriented about the treatment alternatives (observation vs surgical repair). Due to patient symptoms, repair is recommended. Patient oriented about the surgical procedure, the use of mesh and its risk of complications such as: infection, bleeding, injury to vas deference, vasculature and testicle, injury to bowel or bladder, and chronic pain.   Non-recurrent unilateral inguinal hernia without obstruction or gangrene [K40.90]  PLAN: 1.  Robotic assisted laparoscopic right vs bilateral inguinal hernia repair with mesh (16109) 2.  Avoid taking aspirin 5 days before procedure 3.  Contact us if has any question or concern.   Patient verbalized understanding, all questions were answered, and were agreeable with the plan outlined above.    Carolan Shiver, MD  Electronically signed by Carolan Shiver, MD

## 2023-03-11 NOTE — H&P (Signed)
PATIENT PROFILE: Eduardo Perez is a 67 y.o. male who presents to the Clinic for consultation at the request of  [place], [place] for evaluation of inguinal hernia.  PCP:  Marguarite Arbour, MD  HISTORY OF PRESENT ILLNESS: Mr. Eduardo Perez reports he first felt a bulge in the right groin 59-month ago.  He endorses that he was playing golf and he felt bulge of the size of a golf ball.  It got pretty painful and he was unable to keep playing.  He went home and laid down and was able to reduce the bulge.  Since then he has been having pain in the right groin.  No pain radiation.  Pain aggravated with certain activities.  No alleviating factors.  Patient denies any episode of abdominal distention, nausea or vomiting.   PROBLEM LIST: Problem List  Date Reviewed: 02/05/2023          Noted   HTN, goal below 140/90 07/25/2021   Mixed hyperlipidemia 07/25/2021   Idiopathic chronic gout 07/25/2021    GENERAL REVIEW OF SYSTEMS:   General ROS: negative for - chills, fatigue, fever, weight gain or weight loss Allergy and Immunology ROS: negative for - hives  Hematological and Lymphatic ROS: negative for - bleeding problems or bruising, negative for palpable nodes Endocrine ROS: negative for - heat or cold intolerance, hair changes Respiratory ROS: negative for - cough, shortness of breath or wheezing Cardiovascular ROS: no chest pain or palpitations GI ROS: negative for nausea, vomiting, abdominal pain, diarrhea, constipation Musculoskeletal ROS: negative for - joint swelling or muscle pain Neurological ROS: negative for - confusion, syncope Dermatological ROS: negative for pruritus and rash Psychiatric: negative for anxiety, depression, difficulty sleeping and memory loss  MEDICATIONS: Current Outpatient Medications  Medication Sig Dispense Refill   allopurinoL (ZYLOPRIM) 100 MG tablet Take 1 tablet (100 mg total) by mouth once daily 90 tablet 3   carvediloL (COREG) 6.25 MG tablet Take 1 tablet (6.25 mg total) by  mouth 2 (two) times daily with meals 180 tablet 3   magnesium oxide (MAG-OX) 400 mg (241.3 mg magnesium) tablet Take 400 mg by mouth once daily     multivitamin tablet Take 1 tablet by mouth once daily     omega 3-dha-epa-fish oil (FISH OIL) 1,000 mg (120 mg-180 mg) Cap Take by mouth     tadalafiL (CIALIS) 5 MG tablet Take 1 tablet (5 mg total) by mouth once daily 90 tablet 3   No current facility-administered medications for this visit.    ALLERGIES: Patient has no known allergies.  PAST MEDICAL HISTORY: Past Medical History:  Diagnosis Date   Gout    History of blood transfusion 07/2008   Bleeding ulcer   Tachycardia     PAST SURGICAL HISTORY: History reviewed. No pertinent surgical history.   FAMILY HISTORY: No family history on file.   SOCIAL HISTORY: Social History   Socioeconomic History   Marital status: Married  Tobacco Use   Smoking status: Former    Current packs/day: 0.00    Average packs/day: 1 pack/day for 22.0 years (22.0 ttl pk-yrs)    Types: Cigarettes    Start date: 07/28/1976    Quit date: 07/28/1998    Years since quitting: 24.6   Smokeless tobacco: Never  Vaping Use   Vaping status: Never Used  Substance and Sexual Activity   Alcohol use: Yes    Alcohol/week: 1.0 standard drink of alcohol    Types: 1 Standard drinks or equivalent per week  Comment: Patient reports he drinks about 2-3 months   Drug use: Not Currently   Sexual activity: Yes    Partners: Female    PHYSICAL EXAM: Vitals:   03/11/23 1443  BP: (!) 147/85  Pulse: 69   Body mass index is 28.55 kg/m. Weight: 90.3 kg (199 lb)   GENERAL: Alert, active, oriented x3  HEENT: Pupils equal reactive to light. Extraocular movements are intact. Sclera clear. Palpebral conjunctiva normal red color.Pharynx clear.  NECK: Supple with no palpable mass and no adenopathy.  LUNGS: Sound clear with no rales rhonchi or wheezes.  HEART: Regular rhythm S1 and S2 without murmur.  ABDOMEN:  Soft and depressible, nontender with no palpable mass, no hepatomegaly.  Right groin hernia, reducible.  Weakness of the left groin with Valsalba.  EXTREMITIES: Well-developed well-nourished symmetrical with no dependent edema.  NEUROLOGICAL: Awake alert oriented, facial expression symmetrical, moving all extremities.  REVIEW OF DATA: I have reviewed the following data today: Office Visit on 02/05/2023  Component Date Value   Cholesterol, Total 02/05/2023 196    Triglyceride 02/05/2023 118    HDL (High Density Lipopr* 41/32/4401 45.4    LDL Calculated 02/05/2023 027    VLDL Cholesterol 02/05/2023 24    Cholesterol/HDL Ratio 02/05/2023 4.3    WBC (White Blood Cell Co* 02/05/2023 5.9    RBC (Red Blood Cell Coun* 02/05/2023 4.87    Hemoglobin 02/05/2023 15.6    Hematocrit 02/05/2023 43.4    MCV (Mean Corpuscular Vo* 02/05/2023 89.1    MCH (Mean Corpuscular He* 02/05/2023 32.0 (H)    MCHC (Mean Corpuscular H* 02/05/2023 35.9    Platelet Count 02/05/2023 253    RDW-CV (Red Cell Distrib* 02/05/2023 12.9    MPV (Mean Platelet Volum* 02/05/2023 8.8 (L)    Neutrophils 02/05/2023 3.83    Lymphocytes 02/05/2023 1.44    Monocytes 02/05/2023 0.51    Eosinophils 02/05/2023 0.10    Basophils 02/05/2023 0.04    Neutrophil % 02/05/2023 64.5    Lymphocyte % 02/05/2023 24.3    Monocyte % 02/05/2023 8.6    Eosinophil % 02/05/2023 1.7    Basophil% 02/05/2023 0.7    Immature Granulocyte % 02/05/2023 0.2    Immature Granulocyte Cou* 02/05/2023 0.01    Glucose 02/05/2023 96    Sodium 02/05/2023 138    Potassium 02/05/2023 4.4    Chloride 02/05/2023 102    Carbon Dioxide (CO2) 02/05/2023 32.5 (H)    Urea Nitrogen (BUN) 02/05/2023 11    Creatinine 02/05/2023 0.8    Glomerular Filtration Ra* 02/05/2023 96    Calcium 02/05/2023 9.5    AST  02/05/2023 16    ALT  02/05/2023 17    Alk Phos (alkaline Phosp* 02/05/2023 65    Albumin 02/05/2023 4.2    Bilirubin, Total 02/05/2023 0.8    Protein,  Total 02/05/2023 6.7    A/G Ratio 02/05/2023 1.7    PSA (Prostate Specific A* 02/05/2023 4.08 (H)    Color 02/05/2023 Light Yellow    Clarity 02/05/2023 Clear    Specific Gravity 02/05/2023 1.012    pH, Urine 02/05/2023 7.0    Protein, Urinalysis 02/05/2023 Negative    Glucose, Urinalysis 02/05/2023 Negative    Ketones, Urinalysis 02/05/2023 Negative    Blood, Urinalysis 02/05/2023 Negative    Nitrite, Urinalysis 02/05/2023 Negative    Leukocyte Esterase, Urin* 02/05/2023 Negative    Bilirubin, Urinalysis 02/05/2023 Negative    Urobilinogen, Urinalysis 02/05/2023 0.2    WBC, UA 02/05/2023 <1    Red Blood  Cells, Urinaly* 02/05/2023 1    Bacteria, Urinalysis 02/05/2023 0-5    Squamous Epithelial Cell* 02/05/2023 0    Uric Acid 02/05/2023 6.7      ASSESSMENT: Mr. Buchberger is a 67 y.o. male presenting for consultation for right versus bilateral inguinal hernia.    The patient presents with a symptomatic, reducible right versus bilateral inguinal hernia. Patient was oriented about the diagnosis of inguinal hernia and its implication. The patient was oriented about the treatment alternatives (observation vs surgical repair). Due to patient symptoms, repair is recommended. Patient oriented about the surgical procedure, the use of mesh and its risk of complications such as: infection, bleeding, injury to vas deference, vasculature and testicle, injury to bowel or bladder, and chronic pain.   Non-recurrent unilateral inguinal hernia without obstruction or gangrene [K40.90]  PLAN: 1.  Robotic assisted laparoscopic right vs bilateral inguinal hernia repair with mesh (16109) 2.  Avoid taking aspirin 5 days before procedure 3.  Contact us if has any question or concern.   Patient verbalized understanding, all questions were answered, and were agreeable with the plan outlined above.    Carolan Shiver, MD  Electronically signed by Carolan Shiver, MD

## 2023-03-18 ENCOUNTER — Encounter
Admission: RE | Admit: 2023-03-18 | Discharge: 2023-03-18 | Disposition: A | Discharge status: Home / Self Care | Payer: No Typology Code available for payment source | Source: Ambulatory Visit | Attending: General Surgery | Admitting: General Surgery

## 2023-03-18 ENCOUNTER — Other Ambulatory Visit: Payer: Self-pay

## 2023-03-18 VITALS — Ht 71.0 in | Wt 195.0 lb

## 2023-03-18 DIAGNOSIS — R002 Palpitations: Secondary | ICD-10-CM

## 2023-03-18 DIAGNOSIS — E7800 Pure hypercholesterolemia, unspecified: Secondary | ICD-10-CM

## 2023-03-18 DIAGNOSIS — I10 Essential (primary) hypertension: Secondary | ICD-10-CM

## 2023-03-18 HISTORY — DX: Unspecified asthma, uncomplicated: J45.909

## 2023-03-18 NOTE — Patient Instructions (Addendum)
Your procedure is scheduled on: Wednesday 03/26/23 To find out your arrival time, please call [phone] between 1PM - 3PM on:   Tuesday 03/25/23 Report to the Registration Desk on the 1st floor of the [facility]. Free Valet parking is available.  If your arrival time is 6:00 am, do not arrive before that time as the [facility] entrance doors do not open until 6:00 am.  REMEMBER: Instructions that are not followed completely may result in serious medical risk, up to and including death; or upon the discretion of your surgeon and anesthesiologist your surgery may need to be rescheduled.  Do not eat food after midnight the night before surgery.  No gum chewing or hard candies.  You may however, drink CLEAR liquids up to 2 hours before you are scheduled to arrive for your surgery. Do not drink anything within 2 hours of your scheduled arrival time.  Clear liquids include: - water  - apple juice without pulp - gatorade (not RED colors) - black coffee or tea (Do NOT add milk or creamers to the coffee or tea) Do NOT drink anything that is not on this list.  One week prior to surgery: Stop Anti-inflammatories (NSAIDS) such as Advil, Aleve, Ibuprofen, Motrin, Naproxen, Naprosyn and Aspirin based products such as Excedrin, Goody's Powder, BC Powder. You may however, continue to take Tylenol if needed for pain up until the day of surgery.  Stop ANY OVER THE COUNTER supplements or vitamins until after surgery.  Continue taking all prescribed medications.   TAKE ONLY THESE MEDICATIONS THE MORNING OF SURGERY WITH A SIP OF WATER:  carvedilol (COREG) 6.25 MG tablet  allopurinol (ZYLOPRIM) 100 MG tablet if needed  No Alcohol for 24 hours before or after surgery.  No Smoking including e-cigarettes for 24 hours before surgery.  No chewable tobacco products for at least 6 hours before surgery.  No nicotine patches on the day of surgery.  Do not use any "recreational" drugs for at least  a week (preferably 2 weeks) before your surgery.  Please be advised that the combination of cocaine and anesthesia may have negative outcomes, up to and including death. If you test positive for cocaine, your surgery will be cancelled.  On the morning of surgery brush your teeth with toothpaste and water, you may rinse your mouth with mouthwash if you wish. Do not swallow any toothpaste or mouthwash.  Use CHG Soap or wipes as directed on instruction sheet.  Do not wear lotions, powders, or perfumes.   Do not shave body hair from the neck down 48 hours before surgery.  Wear comfortable clothing (specific to your surgery type) to the hospital.  Do not wear jewelry, make-up, hairpins, clips or nail polish.  For welded (permanent) jewelry: bracelets, anklets, waist bands, etc.  Please have this removed prior to surgery.  If it is not removed, there is a chance that hospital personnel will need to cut it off on the day of surgery. Contact lenses, hearing aids and dentures may not be worn into surgery.  Do not bring valuables to the hospital. [facility] is not responsible for any missing/lost belongings or valuables.   Notify your doctor if there is any change in your medical condition (cold, fever, infection).  If you are being discharged the day of surgery, you will not be allowed to drive home. You will need a responsible individual to drive you home and stay with you for 24 hours after surgery.   If you are  taking public transportation, you will need to have a responsible individual with you.  If you are being admitted to the hospital overnight, leave your suitcase in the car. After surgery it may be brought to your room.  In case of increased patient census, it may be necessary for you, the patient, to continue your postoperative care in the Same Day Surgery department.  After surgery, you can help prevent lung complications by doing breathing exercises.  Take deep breaths and cough  every 1-2 hours. Your doctor may order a device called an Incentive Spirometer to help you take deep breaths. When coughing or sneezing, hold a pillow firmly against your incision with both hands. This is called "splinting." Doing this helps protect your incision. It also decreases belly discomfort.  Surgery Visitation Policy:  Patients undergoing a surgery or procedure may have two family members or support persons with them as long as the person is not COVID-19 positive or experiencing its symptoms.   Inpatient Visitation:    Visiting hours are 7 a.m. to 8 p.m. Up to four visitors are allowed at one time in a patient room. The visitors may rotate out with other people during the day. One designated support person (adult) may remain overnight.  Please call the Pre-admissions Testing Dept. at [phone] if you have any questions about these instructions.    Preparing for Surgery with CHLORHEXIDINE GLUCONATE (CHG) Soap  Chlorhexidine Gluconate (CHG) Soap  o An antiseptic cleaner that kills germs and bonds with the skin to continue killing germs even after washing  o Used for showering the night before surgery and morning of surgery  Before surgery, you can play an important role by reducing the number of germs on your skin.  CHG (Chlorhexidine gluconate) soap is an antiseptic cleanser which kills germs and bonds with the skin to continue killing germs even after washing.  Please do not use if you have an allergy to CHG or antibacterial soaps. If your skin becomes reddened/irritated stop using the CHG.  1. Shower the NIGHT BEFORE SURGERY and the MORNING OF SURGERY with CHG soap.  2. If you choose to wash your hair, wash your hair first as usual with your normal shampoo.  3. After shampooing, rinse your hair and body thoroughly to remove the shampoo.  4. Use CHG as you would any other liquid soap. You can apply CHG directly to the skin and wash gently with a scrungie or a clean  washcloth.  5. Apply the CHG soap to your body only from the neck down. Do not use on open wounds or open sores. Avoid contact with your eyes, ears, mouth, and genitals (private parts). Wash face and genitals (private parts) with your normal soap.  6. Wash thoroughly, paying special attention to the area where your surgery will be performed.  7. Thoroughly rinse your body with warm water.  8. Do not shower/wash with your normal soap after using and rinsing off the CHG soap.  9. Pat yourself dry with a clean towel.  10. Wear clean pajamas to bed the night before surgery.  12. Place clean sheets on your bed the night of your first shower and do not sleep with pets.  13. Shower again with the CHG soap on the day of surgery prior to arriving at the hospital.  14. Do not apply any deodorants/lotions/powders.  15. Please wear clean clothes to the hospital.

## 2023-03-19 ENCOUNTER — Encounter
Admission: RE | Admit: 2023-03-19 | Discharge: 2023-03-19 | Disposition: A | Discharge status: Home / Self Care | Payer: No Typology Code available for payment source | Source: Ambulatory Visit | Attending: General Surgery | Admitting: General Surgery

## 2023-03-19 DIAGNOSIS — I10 Essential (primary) hypertension: Secondary | ICD-10-CM | POA: Diagnosis not present

## 2023-03-19 DIAGNOSIS — Z01810 Encounter for preprocedural cardiovascular examination: Secondary | ICD-10-CM | POA: Insufficient documentation

## 2023-03-19 DIAGNOSIS — E7800 Pure hypercholesterolemia, unspecified: Secondary | ICD-10-CM | POA: Diagnosis not present

## 2023-03-19 DIAGNOSIS — R9431 Abnormal electrocardiogram [ECG] [EKG]: Secondary | ICD-10-CM | POA: Diagnosis not present

## 2023-03-19 DIAGNOSIS — Z01818 Encounter for other preprocedural examination: Secondary | ICD-10-CM | POA: Diagnosis present

## 2023-03-19 DIAGNOSIS — R002 Palpitations: Secondary | ICD-10-CM | POA: Diagnosis not present

## 2023-03-25 MED ORDER — LACTATED RINGERS IV SOLN: INTRAVENOUS | Status: DC

## 2023-03-25 MED ORDER — CEFAZOLIN SODIUM-DEXTROSE 2-4 GM/100ML-% IV SOLN
2.0000 g | INTRAVENOUS | Status: AC
  Administered 2023-03-26: 2 g via INTRAVENOUS

## 2023-03-25 MED ORDER — CHLORHEXIDINE GLUCONATE 0.12 % MT SOLN
15.0000 mL | Freq: Once | OROMUCOSAL | Status: AC
  Administered 2023-03-26: 15 mL via OROMUCOSAL

## 2023-03-25 MED ORDER — ORAL CARE MOUTH RINSE: 15.0000 mL | Freq: Once | OROMUCOSAL | Status: AC

## 2023-03-26 ENCOUNTER — Encounter
Admission: RE | Disposition: A | Discharge status: Home / Self Care | Payer: Self-pay | Source: Ambulatory Visit | Attending: General Surgery

## 2023-03-26 ENCOUNTER — Ambulatory Visit
Admission: RE | Admit: 2023-03-26 | Discharge: 2023-03-26 | Disposition: A | Discharge status: Home / Self Care | Payer: No Typology Code available for payment source | Source: Ambulatory Visit | Attending: General Surgery | Admitting: General Surgery

## 2023-03-26 ENCOUNTER — Ambulatory Visit: Payer: No Typology Code available for payment source | Admitting: Anesthesiology

## 2023-03-26 ENCOUNTER — Encounter: Payer: Self-pay | Admitting: General Surgery

## 2023-03-26 ENCOUNTER — Other Ambulatory Visit: Payer: Self-pay

## 2023-03-26 DIAGNOSIS — K4020 Bilateral inguinal hernia, without obstruction or gangrene, not specified as recurrent: Secondary | ICD-10-CM | POA: Diagnosis not present

## 2023-03-26 DIAGNOSIS — Z87891 Personal history of nicotine dependence: Secondary | ICD-10-CM | POA: Diagnosis not present

## 2023-03-26 DIAGNOSIS — E782 Mixed hyperlipidemia: Secondary | ICD-10-CM | POA: Insufficient documentation

## 2023-03-26 DIAGNOSIS — K4090 Unilateral inguinal hernia, without obstruction or gangrene, not specified as recurrent: Secondary | ICD-10-CM | POA: Diagnosis not present

## 2023-03-26 DIAGNOSIS — I10 Essential (primary) hypertension: Secondary | ICD-10-CM | POA: Diagnosis not present

## 2023-03-26 DIAGNOSIS — Z8711 Personal history of peptic ulcer disease: Secondary | ICD-10-CM | POA: Insufficient documentation

## 2023-03-26 HISTORY — PX: INSERTION OF MESH: SHX5868

## 2023-03-26 SURGERY — REPAIR, HERNIA, INGUINAL, BILATERAL, ROBOT-ASSISTED
Anesthesia: General | Site: Inguinal | Laterality: Bilateral

## 2023-03-26 MED ORDER — ACETAMINOPHEN 10 MG/ML IV SOLN
INTRAVENOUS | Status: DC | PRN
  Administered 2023-03-26: 1000 mg via INTRAVENOUS

## 2023-03-26 MED ORDER — OXYCODONE HCL 5 MG PO TABS: 5.0000 mg | ORAL_TABLET | Freq: Once | ORAL | Status: AC | PRN

## 2023-03-26 MED ORDER — ONDANSETRON HCL 4 MG/2ML IJ SOLN
INTRAMUSCULAR | Status: AC
  Filled 2023-03-26: qty 2

## 2023-03-26 MED ORDER — PROPOFOL 10 MG/ML IV BOLUS
INTRAVENOUS | Status: DC | PRN
  Administered 2023-03-26: 200 mg via INTRAVENOUS

## 2023-03-26 MED ORDER — KETAMINE HCL 50 MG/5ML IJ SOSY
PREFILLED_SYRINGE | INTRAMUSCULAR | Status: DC | PRN
  Administered 2023-03-26 (×2): 25 mg via INTRAVENOUS

## 2023-03-26 MED ORDER — PROPOFOL 10 MG/ML IV BOLUS
INTRAVENOUS | Status: AC
  Filled 2023-03-26: qty 20

## 2023-03-26 MED ORDER — OXYCODONE HCL 5 MG/5ML PO SOLN
5.0000 mg | Freq: Once | ORAL | Status: AC | PRN
  Administered 2023-03-26: 5 mg via ORAL

## 2023-03-26 MED ORDER — FENTANYL CITRATE (PF) 100 MCG/2ML IJ SOLN
INTRAMUSCULAR | Status: DC | PRN
  Administered 2023-03-26 (×2): 50 ug via INTRAVENOUS

## 2023-03-26 MED ORDER — ACETAMINOPHEN 10 MG/ML IV SOLN
INTRAVENOUS | Status: AC
  Filled 2023-03-26: qty 100

## 2023-03-26 MED ORDER — ROCURONIUM BROMIDE 10 MG/ML (PF) SYRINGE
PREFILLED_SYRINGE | INTRAVENOUS | Status: AC
  Filled 2023-03-26: qty 10

## 2023-03-26 MED ORDER — OXYCODONE HCL 5 MG/5ML PO SOLN
ORAL | Status: AC
  Filled 2023-03-26: qty 5

## 2023-03-26 MED ORDER — KETAMINE HCL 50 MG/5ML IJ SOSY
PREFILLED_SYRINGE | INTRAMUSCULAR | Status: AC
  Filled 2023-03-26: qty 5

## 2023-03-26 MED ORDER — CHLORHEXIDINE GLUCONATE 0.12 % MT SOLN
OROMUCOSAL | Status: AC
  Filled 2023-03-26: qty 15

## 2023-03-26 MED ORDER — ONDANSETRON HCL 4 MG/2ML IJ SOLN
INTRAMUSCULAR | Status: DC | PRN
  Administered 2023-03-26: 4 mg via INTRAVENOUS

## 2023-03-26 MED ORDER — HYDROMORPHONE HCL 1 MG/ML IJ SOLN
0.2500 mg | INTRAMUSCULAR | Status: DC | PRN
  Administered 2023-03-26 (×4): 0.25 mg via INTRAVENOUS

## 2023-03-26 MED ORDER — BUPIVACAINE-EPINEPHRINE 0.25% -1:200000 IJ SOLN
INTRAMUSCULAR | Status: DC | PRN
  Administered 2023-03-26: 30 mL

## 2023-03-26 MED ORDER — BUPIVACAINE-EPINEPHRINE (PF) 0.25% -1:200000 IJ SOLN
INTRAMUSCULAR | Status: AC
  Filled 2023-03-26: qty 30

## 2023-03-26 MED ORDER — LIDOCAINE HCL 4 % MT SOLN
OROMUCOSAL | Status: DC | PRN
  Administered 2023-03-26: 4 mL via TOPICAL

## 2023-03-26 MED ORDER — LIDOCAINE HCL (PF) 2 % IJ SOLN
INTRAMUSCULAR | Status: AC
  Filled 2023-03-26: qty 5

## 2023-03-26 MED ORDER — LIDOCAINE HCL (CARDIAC) PF 100 MG/5ML IV SOSY
PREFILLED_SYRINGE | INTRAVENOUS | Status: DC | PRN
  Administered 2023-03-26: 80 mg via INTRAVENOUS

## 2023-03-26 MED ORDER — DEXAMETHASONE SODIUM PHOSPHATE 10 MG/ML IJ SOLN
INTRAMUSCULAR | Status: AC
  Filled 2023-03-26: qty 1

## 2023-03-26 MED ORDER — DEXAMETHASONE SODIUM PHOSPHATE 10 MG/ML IJ SOLN
INTRAMUSCULAR | Status: DC | PRN
  Administered 2023-03-26: 10 mg via INTRAVENOUS

## 2023-03-26 MED ORDER — CEFAZOLIN SODIUM-DEXTROSE 2-4 GM/100ML-% IV SOLN
INTRAVENOUS | Status: AC
  Filled 2023-03-26: qty 100

## 2023-03-26 MED ORDER — HYDROCODONE-ACETAMINOPHEN 5-325 MG PO TABS: 1.0000 | ORAL_TABLET | ORAL | 0 refills | Status: AC | PRN

## 2023-03-26 MED ORDER — ROCURONIUM BROMIDE 100 MG/10ML IV SOLN
INTRAVENOUS | Status: DC | PRN
  Administered 2023-03-26: 20 mg via INTRAVENOUS
  Administered 2023-03-26: 50 mg via INTRAVENOUS
  Administered 2023-03-26: 20 mg via INTRAVENOUS

## 2023-03-26 MED ORDER — MIDAZOLAM HCL 2 MG/2ML IJ SOLN
INTRAMUSCULAR | Status: DC | PRN
  Administered 2023-03-26: 2 mg via INTRAVENOUS

## 2023-03-26 MED ORDER — SUGAMMADEX SODIUM 200 MG/2ML IV SOLN
INTRAVENOUS | Status: DC | PRN
  Administered 2023-03-26: 200 mg via INTRAVENOUS

## 2023-03-26 MED ORDER — HYDROMORPHONE HCL 1 MG/ML IJ SOLN
INTRAMUSCULAR | Status: AC
  Filled 2023-03-26: qty 1

## 2023-03-26 MED ORDER — 0.9 % SODIUM CHLORIDE (POUR BTL) OPTIME
TOPICAL | Status: DC | PRN
  Administered 2023-03-26: 500 mL

## 2023-03-26 MED ORDER — FENTANYL CITRATE (PF) 100 MCG/2ML IJ SOLN
INTRAMUSCULAR | Status: AC
  Filled 2023-03-26: qty 2

## 2023-03-26 SURGICAL SUPPLY — 50 items
ADH SKN CLS APL DERMABOND .7 (GAUZE/BANDAGES/DRESSINGS) ×1
BAG PRESSURE INF REUSE 1000 (BAG) IMPLANT
BLADE SURG SZ11 CARB STEEL (BLADE) ×1 IMPLANT
COVER TIP SHEARS 8 DVNC (MISCELLANEOUS) ×1 IMPLANT
COVER WAND RF STERILE (DRAPES) ×1 IMPLANT
DERMABOND ADVANCED .7 DNX12 (GAUZE/BANDAGES/DRESSINGS) ×1 IMPLANT
DRAPE ARM DVNC X/XI (DISPOSABLE) ×3 IMPLANT
DRAPE COLUMN DVNC XI (DISPOSABLE) ×1 IMPLANT
ELECT REM PT RETURN 9FT ADLT (ELECTROSURGICAL) ×1
ELECTRODE REM PT RTRN 9FT ADLT (ELECTROSURGICAL) ×1 IMPLANT
FORCEPS BPLR R/ABLATION 8 DVNC (INSTRUMENTS) ×1 IMPLANT
GLOVE BIO SURGEON STRL SZ 6.5 (GLOVE) ×2 IMPLANT
GLOVE BIOGEL PI IND STRL 6.5 (GLOVE) ×2 IMPLANT
GOWN STRL REUS W/ TWL LRG LVL3 (GOWN DISPOSABLE) ×3 IMPLANT
GOWN STRL REUS W/TWL LRG LVL3 (GOWN DISPOSABLE) ×3
IRRIGATOR SUCT 8 DISP DVNC XI (IRRIGATION / IRRIGATOR) IMPLANT
IV CATH ANGIO 12GX3 LT BLUE (NEEDLE) IMPLANT
IV NS 1000ML (IV SOLUTION)
IV NS 1000ML BAXH (IV SOLUTION) IMPLANT
KIT PINK PAD W/HEAD ARE REST (MISCELLANEOUS) ×1
KIT PINK PAD W/HEAD ARM REST (MISCELLANEOUS) ×1 IMPLANT
LABEL OR SOLS (LABEL) IMPLANT
MANIFOLD NEPTUNE II (INSTRUMENTS) ×1 IMPLANT
MESH 3DMAX MID 5X7 LT XLRG (Mesh General) IMPLANT
MESH 3DMAX MID 5X7 RT XLRG (Mesh General) IMPLANT
NDL DRIVE SUT CUT DVNC (INSTRUMENTS) ×1 IMPLANT
NDL HYPO 22X1.5 SAFETY MO (MISCELLANEOUS) ×1 IMPLANT
NDL INSUFFLATION 14GA 120MM (NEEDLE) ×1 IMPLANT
NEEDLE DRIVE SUT CUT DVNC (INSTRUMENTS) ×1 IMPLANT
NEEDLE HYPO 22X1.5 SAFETY MO (MISCELLANEOUS) ×1 IMPLANT
NEEDLE INSUFFLATION 14GA 120MM (NEEDLE) ×1 IMPLANT
OBTURATOR OPTICAL STND 8 DVNC (TROCAR) ×1
OBTURATOR OPTICALSTD 8 DVNC (TROCAR) ×1 IMPLANT
PACK LAP CHOLECYSTECTOMY (MISCELLANEOUS) ×1 IMPLANT
SCISSORS MNPLR CVD DVNC XI (INSTRUMENTS) ×1 IMPLANT
SEAL UNIV 5-12 XI (MISCELLANEOUS) ×3 IMPLANT
SET TUBE SMOKE EVAC HIGH FLOW (TUBING) ×1 IMPLANT
SOL ELECTROSURG ANTI STICK (MISCELLANEOUS) ×1
SOLUTION ELECTROSURG ANTI STCK (MISCELLANEOUS) ×1 IMPLANT
SUT MNCRL 4-0 (SUTURE) ×1
SUT MNCRL 4-0 27XMFL (SUTURE) ×1
SUT STRATA 2-0 23CM CT-2 (SUTURE) IMPLANT
SUT VIC AB 2-0 SH 27 (SUTURE) ×1
SUT VIC AB 2-0 SH 27XBRD (SUTURE) ×1 IMPLANT
SUT VLOC 90 S/L VL9 GS22 (SUTURE) ×1 IMPLANT
SUTURE MNCRL 4-0 27XMF (SUTURE) ×1 IMPLANT
TAPE TRANSPORE STRL 2 31045 (GAUZE/BANDAGES/DRESSINGS) IMPLANT
TRAP FLUID SMOKE EVACUATOR (MISCELLANEOUS) ×1 IMPLANT
TRAY FOLEY MTR SLVR 16FR STAT (SET/KITS/TRAYS/PACK) ×1 IMPLANT
WATER STERILE IRR 500ML POUR (IV SOLUTION) ×1 IMPLANT

## 2023-03-26 NOTE — Interval H&P Note (Signed)
History and Physical Interval Note:  03/26/2023 1:37 PM  Eduardo Perez  has presented today for surgery, with the diagnosis of K40.90 non recurrent unilateral inguinal hernia w/o obstruction or gangrene.  The various methods of treatment have been discussed with the patient and family. After consideration of risks, benefits and other options for treatment, the patient has consented to  Procedure(s): XI ROBOTIC ASSISTED BILATERAL INGUINAL HERNIA (Bilateral) as a surgical intervention.  The patient's history has been reviewed, patient examined, no change in status, stable for surgery.  I have reviewed the patient's chart and labs.  Questions were answered to the patient's satisfaction.     Carolan Shiver

## 2023-03-26 NOTE — Transfer of Care (Signed)
Immediate Anesthesia Transfer of Care Note  Patient: Eduardo Perez  Procedure(s) Performed: XI ROBOTIC ASSISTED BILATERAL INGUINAL HERNIA (Bilateral: Inguinal) INSERTION OF MESH (Bilateral: Inguinal)  Patient Location: PACU  Anesthesia Type:General  Level of Consciousness: awake  Airway & Oxygen Therapy: Patient Spontanous Breathing and Patient connected to face mask oxygen  Post-op Assessment: Report given to RN and Post -op Vital signs reviewed and stable  Post vital signs: Reviewed and stable  Last Vitals:  Vitals Value Taken Time  BP 165/87 03/26/23 1541  Temp    Pulse 59 03/26/23 1544  Resp 14 03/26/23 1544  SpO2 100 % 03/26/23 1544  Vitals shown include unfiled device data.  Last Pain:  Vitals:   03/26/23 1134  TempSrc: Temporal  PainSc: 0-No pain      Patients Stated Pain Goal: 0 (03/26/23 1134)  Complications: No notable events documented.

## 2023-03-26 NOTE — Anesthesia Preprocedure Evaluation (Signed)
Anesthesia Evaluation  Patient identified by MRN, date of birth, ID band Patient awake    Reviewed: Allergy & Precautions, NPO status , Patient's Chart, lab work & pertinent test results  History of Anesthesia Complications Negative for: history of anesthetic complications  Airway Mallampati: I  TM Distance: >3 FB Neck ROM: full    Dental no notable dental hx.    Pulmonary former smoker   Pulmonary exam normal        Cardiovascular hypertension, On Medications negative cardio ROS Normal cardiovascular exam     Neuro/Psych negative neurological ROS  negative psych ROS   GI/Hepatic Neg liver ROS, PUD,,,  Endo/Other  negative endocrine ROS    Renal/GU      Musculoskeletal  (+) Arthritis ,    Abdominal   Peds  Hematology negative hematology ROS (+)   Anesthesia Other Findings Past Medical History: 12/06/2014: Adiposity No date: Asthma     Comment:  childhood 12/06/2014: Avitaminosis D No date: COVID-19     Comment:  about a year ago 12/06/2014: Essential (primary) hypertension     Comment:  Intolerant of Losartan-palpitation.  No date: Gout No date: Hypertension 12/06/2014: Osteoarthrosis, ankle and foot 12/06/2014: Peptic ulcer     Comment:  History of. No NSAIDs.  No date: Tachycardia  Past Surgical History: 11/11/2019: COLONOSCOPY WITH PROPOFOL; N/A     Comment:  Procedure: COLONOSCOPY WITH PROPOFOL;  Surgeon: Midge Minium, MD;  Location: [facility];  Service:               Endoscopy;  Laterality: N/A;  Priority 4 07/2008: Hx of transfusion of packed red blood cells  BMI    Body Mass Index: 27.20 kg/m      Reproductive/Obstetrics negative OB ROS                             Anesthesia Physical Anesthesia Plan  ASA: 2  Anesthesia Plan: General ETT   Post-op Pain Management: Toradol IV (intra-op)*, Ofirmev IV (intra-op)* and Ketamine IV*    Induction: Intravenous  PONV Risk Score and Plan: 2 and Ondansetron, Dexamethasone, Midazolam and Treatment may vary due to age or medical condition  Airway Management Planned: Oral ETT  Additional Equipment:   Intra-op Plan:   Post-operative Plan: Extubation in OR  Informed Consent: I have reviewed the patients History and Physical, chart, labs and discussed the procedure including the risks, benefits and alternatives for the proposed anesthesia with the patient or authorized representative who has indicated his/her understanding and acceptance.     Dental Advisory Given  Plan Discussed with: Anesthesiologist, CRNA and Surgeon  Anesthesia Plan Comments: (Patient consented for risks of anesthesia including but not limited to:  - adverse reactions to medications - damage to eyes, teeth, lips or other oral mucosa - nerve damage due to positioning  - sore throat or hoarseness - Damage to heart, brain, nerves, lungs, other parts of body or loss of life  Patient voiced understanding and assent.)       Anesthesia Quick Evaluation

## 2023-03-26 NOTE — Anesthesia Postprocedure Evaluation (Signed)
Anesthesia Post Note  Patient: Eduardo Perez  Procedure(s) Performed: XI ROBOTIC ASSISTED BILATERAL INGUINAL HERNIA (Bilateral: Inguinal) INSERTION OF MESH (Bilateral: Inguinal)  Patient location during evaluation: PACU Anesthesia Type: General Level of consciousness: awake and alert Pain management: pain level controlled Vital Signs Assessment: post-procedure vital signs reviewed and stable Respiratory status: spontaneous breathing, nonlabored ventilation, respiratory function stable and patient connected to nasal cannula oxygen Cardiovascular status: blood pressure returned to baseline and stable Postop Assessment: no apparent nausea or vomiting Anesthetic complications: no   No notable events documented.   Last Vitals:  Vitals:   03/26/23 1545 03/26/23 1600  BP: (!) 161/91 (!) 164/93  Pulse: (!) 59 64  Resp: 15 14  Temp:    SpO2: 100% 98%    Last Pain:  Vitals:   03/26/23 1600  TempSrc:   PainSc: 8                  Louie Boston

## 2023-03-26 NOTE — Anesthesia Procedure Notes (Signed)
Procedure Name: Intubation Date/Time: 03/26/2023 1:56 PM  Performed by: Elisabeth Pigeon, CRNAPre-anesthesia Checklist: Patient identified, Patient being monitored, Timeout performed, Emergency Drugs available and Suction available Patient Re-evaluated:Patient Re-evaluated prior to induction Oxygen Delivery Method: Circle system utilized Preoxygenation: Pre-oxygenation with 100% oxygen Induction Type: IV induction Ventilation: Mask ventilation with difficulty and Oral airway inserted - appropriate to patient size Laryngoscope Size: Mac, McGraph and 4 Grade View: Grade I Tube type: Oral Tube size: 7.5 mm Number of attempts: 1 Airway Equipment and Method: Stylet Placement Confirmation: ETT inserted through vocal cords under direct vision, positive ETCO2 and breath sounds checked- equal and bilateral Secured at: 22 cm Tube secured with: Tape Dental Injury: Teeth and Oropharynx as per pre-operative assessment

## 2023-03-26 NOTE — Op Note (Signed)
Preoperative diagnosis: Bilateral inguinal hernia.   Postoperative diagnosis: Bilateral inguinal hernia.  Procedure: Robotic assisted Laparoscopic Transabdominal preperitoneal laparoscopic (TAPP) repair of bilateral inguinal hernia.  Anesthesia: GETA  Surgeon: Dr. Hazle Quant  Wound Classification: Clean  Indications:  Patient is a 67 y.o. male developed a symptomatic bilateral inguinal hernia. Repair was indicated.  Findings: 1. Bilateral Inguinal hernia identified 2. Vas deferens and cord structures identified and preserved 3. Bard Extra Large 3D Max MID Anatomical mesh used for repair 4. Adequate hemostasis.          Description of procedure:  The patient was taken to the operating room and the correct side of surgery was verified. The patient was placed supine with right arm tucked at the side. After obtaining adequate anesthesia, the patient's abdomen was prepped and draped in standard sterile fashion. A time-out was completed verifying correct patient, procedure, site, positioning, and implant(s) and/or special equipment prior to beginning this procedure.  An incision was made in a natural skin line above the umbilicus. The fascia was elevated and the Veress needle inserted. Proper position was confirmed by aspiration and saline meniscus test.  The abdomen was insufflated with carbon dioxide to a pressure of 15 mmHg. The patient tolerated insufflation well.  Abdominal cavity was entered using Optiview technique with a millimeter trocar.  No injury was identified.  Another 2 mm trocars were placed lateral to each rectus muscle.  Scissors and bipolar forceps were inserted under direct visualization. Both hernias repaired using the following technique: At the robotic console: Transverse peritoneal incision is made about 8 cm superior to the inguinal defect. Medial to the epigastric vessels, the parietal compartment is dissected to visualize the rectus muscle. This is carried down  to the symphysis pubis and the retropubic space is dissected to expose at least 2 cm contralateral to the midline. Cooper's ligament is exposed and cleared at least 2 cm below the ligament to ensure adequate space for the inferior border of the mesh. Hesselbach's triangle is cleared assessing for a direct hernia. Lateral to the epigastric vessels, the dissection is carried out in visceral compartment continuing in the true preperitoneal plane. Indirect hernia sac, was carefully reduced and separated from the cord structures with medial retraction and a combination of blunt/sharp dissection and focused cautery. This dissection was continued until the cord structures are "parietalized" completely, allowing for visualization of the reflected peritoneum that is continuous with the line originating 2 cm below Coopers medially and across the psoas muscle in the lateral compartment.  The internal ring was interrogated for a cord lipoma. The cord lipoma was reduced to the retroperitoneum and seated dorsal to the preperitoneal mesh. Having achieved a complete dissection with a critical view of the entire myopectineal orifice, on each side, an XL mesh was then positioned centered at the iliopubic tract with the medial side crossing the midline and the inferior edge positioned 2 cm below Coopers ligament. The lateral aspect of the mesh extended 3-5 cm beyond the lateral edge of the psoas. The mesh is fixated using an interrupted suture placed to the ipsilateral Coopers ligament. A second suture was done at the medial superior aspect of the mesh fixating this to the rectus complex.  The peritoneal flap is closed with running barbed suture. Additional holes in the peritoneum closed with suture. Preperitoneal space gas aspirated to visualize the peritoneum apposed directly against the mesh and ensure no folding, lifting, or buckling of the mesh. Skin is closed, sterile dressings are applied.  The patient tolerated the  procedure well and was taken to the postanesthesia care unit in stable condition  Specimen: None  Complications: None  Estimated Blood Loss: 5 mL

## 2023-03-26 NOTE — Discharge Instructions (Addendum)
  Diet: Resume home heart healthy regular diet.   Activity: No heavy lifting >20 pounds (children, pets, laundry, garbage) or strenuous activity until follow-up, but light activity and walking are encouraged. Do not drive or drink alcohol if taking narcotic pain medications.  Wound care: May shower with soapy water and pat dry (do not rub incisions), but no baths or submerging incision underwater until follow-up. (no swimming)   Medications: Resume all home medications. For mild to moderate pain: acetaminophen (Tylenol) ***or ibuprofen (if no kidney disease). Combining Tylenol with alcohol can substantially increase your risk of causing liver disease. Narcotic pain medications, if prescribed, can be used for severe pain, though may cause nausea, constipation, and drowsiness. Do not combine Tylenol and Norco within a 6 hour period as Norco contains Tylenol. If you do not need the narcotic pain medication, you do not need to fill the prescription.  Call office ([phone]) at any time if any questions, worsening pain, fevers/chills, bleeding, drainage from incision site, or other concerns.   AMBULATORY SURGERY  DISCHARGE INSTRUCTIONS   The drugs that you were given will stay in your system until tomorrow so for the next 24 hours you should not:  Drive an automobile Make any legal decisions Drink any alcoholic beverage   You may resume regular meals tomorrow.  Today it is better to start with liquids and gradually work up to solid foods.  You may eat anything you prefer, but it is better to start with liquids, then soup and crackers, and gradually work up to solid foods.   Please notify your doctor immediately if you have any unusual bleeding, trouble breathing, redness and pain at the surgery site, drainage, fever, or pain not relieved by medication.    Additional Instructions:        Please contact your physician with any problems or Same Day Surgery at [phone], Monday  through Friday 6 am to 4 pm, or [place] [facility] [place] Main number at [phone].  

## 2023-03-27 ENCOUNTER — Encounter: Payer: Self-pay | Admitting: General Surgery

## 2023-04-02 DIAGNOSIS — Z0100 Encounter for examination of eyes and vision without abnormal findings: Secondary | ICD-10-CM | POA: Diagnosis not present

## 2023-04-02 DIAGNOSIS — H5213 Myopia, bilateral: Secondary | ICD-10-CM | POA: Diagnosis not present

## 2023-04-30 DIAGNOSIS — E785 Hyperlipidemia, unspecified: Secondary | ICD-10-CM | POA: Diagnosis not present

## 2023-04-30 DIAGNOSIS — Z008 Encounter for other general examination: Secondary | ICD-10-CM | POA: Diagnosis not present

## 2023-04-30 DIAGNOSIS — E663 Overweight: Secondary | ICD-10-CM | POA: Diagnosis not present

## 2023-04-30 DIAGNOSIS — I471 Supraventricular tachycardia, unspecified: Secondary | ICD-10-CM | POA: Diagnosis not present

## 2023-04-30 DIAGNOSIS — F17211 Nicotine dependence, cigarettes, in remission: Secondary | ICD-10-CM | POA: Diagnosis not present

## 2023-04-30 DIAGNOSIS — I10 Essential (primary) hypertension: Secondary | ICD-10-CM | POA: Diagnosis not present

## 2023-04-30 DIAGNOSIS — Z6827 Body mass index (BMI) 27.0-27.9, adult: Secondary | ICD-10-CM | POA: Diagnosis not present

## 2023-05-14 DIAGNOSIS — R972 Elevated prostate specific antigen [PSA]: Secondary | ICD-10-CM | POA: Diagnosis not present

## 2023-06-10 DIAGNOSIS — M9905 Segmental and somatic dysfunction of pelvic region: Secondary | ICD-10-CM | POA: Diagnosis not present

## 2023-06-10 DIAGNOSIS — M5431 Sciatica, right side: Secondary | ICD-10-CM | POA: Diagnosis not present

## 2023-06-10 DIAGNOSIS — M545 Low back pain, unspecified: Secondary | ICD-10-CM | POA: Diagnosis not present

## 2023-06-10 DIAGNOSIS — M9903 Segmental and somatic dysfunction of lumbar region: Secondary | ICD-10-CM | POA: Diagnosis not present

## 2023-06-18 DIAGNOSIS — M9905 Segmental and somatic dysfunction of pelvic region: Secondary | ICD-10-CM | POA: Diagnosis not present

## 2023-06-18 DIAGNOSIS — M9903 Segmental and somatic dysfunction of lumbar region: Secondary | ICD-10-CM | POA: Diagnosis not present

## 2023-06-18 DIAGNOSIS — M5431 Sciatica, right side: Secondary | ICD-10-CM | POA: Diagnosis not present

## 2023-06-18 DIAGNOSIS — M545 Low back pain, unspecified: Secondary | ICD-10-CM | POA: Diagnosis not present

## 2023-06-26 ENCOUNTER — Encounter: Payer: Self-pay | Admitting: Urology

## 2023-06-26 ENCOUNTER — Ambulatory Visit (INDEPENDENT_AMBULATORY_CARE_PROVIDER_SITE_OTHER): Payer: Self-pay | Admitting: Urology

## 2023-06-26 VITALS — BP 144/77 | HR 75 | Ht 71.0 in | Wt 195.0 lb

## 2023-06-26 DIAGNOSIS — R972 Elevated prostate specific antigen [PSA]: Secondary | ICD-10-CM

## 2023-06-26 NOTE — Progress Notes (Signed)
I, Eduardo Perez, acting as a scribe for Eduardo Altes, MD., have documented all relevant documentation on the behalf of Eduardo Altes, MD, as directed by Eduardo Altes, MD while in the presence of Eduardo Altes, MD.  06/26/2023 2:43 PM   Eduardo Perez [birth year] [id number]  Referring provider: Marguarite Arbour, MD [address] [facility] [place],  [place] [address]  Chief Complaint  Patient presents with   Elevated PSA    HPI: Eduardo Perez is a 68 y.o. male referred for evaluation of an elevated PSA.   Baseline PSA for the last several years has been in the upper 1-mid 2 range.  It was 2.66 in September 2023. In February 2024 it was 4.83 and was repeated q month later and was 3.99 PSA September 2024 was 4.08 and repeated in early December of 2024 and was 5.35  No bothersome lower urinary tract symptoms.  No dysuria, gross hematuria Denies flank, abdominal, or pelvic pain.  No family history of prostate cancer.  Urinalysis September 2024 showed no pyuria or microhematuria.   PMH: Past Medical History:  Diagnosis Date   Adiposity 12/06/2014   Asthma    childhood   Avitaminosis D 12/06/2014   COVID-19    about a year ago   Essential (primary) hypertension 12/06/2014   Intolerant of Losartan-palpitation.    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 No NSAIDs.    Tachycardia     Surgical History: Past Surgical History:  Procedure Laterality Date   COLONOSCOPY WITH PROPOFOL N/A 11/11/2019   Procedure: COLONOSCOPY WITH PROPOFOL;  Surgeon: Midge Minium, MD;  Location: [facility];  Service: Endoscopy;  Laterality: N/A;  Priority 4   Hx of transfusion of packed red blood cells  07/2008   INSERTION OF MESH Bilateral 03/26/2023   Procedure: INSERTION OF MESH;  Surgeon: Carolan Shiver, MD;  Location: [facility];  Service: General;  Laterality: Bilateral;    Home  Medications:  Allergies as of 06/26/2023       Reactions   Losartan    palpitation   Stevia Rebaudiana Extract [stevioside]         Medication List        Accurate as of June 26, 2023  2:43 PM. If you have any questions, ask your nurse or doctor.          allopurinol 100 MG tablet Commonly known as: ZYLOPRIM Take 1 tablet (100 mg total) by mouth daily.   carvedilol 6.25 MG tablet Commonly known as: COREG Take 1 tablet (6.25 mg total) by mouth 2 (two) times daily with a meal.   FISH OIL PO Take 1 capsule by mouth daily.   multivitamin tablet Take 1 tablet by mouth daily. Fish oil        Allergies:  Allergies  Allergen Reactions   Losartan     palpitation   Stevia Rebaudiana Extract [Stevioside]     Family History: Family History  Problem Relation Age of Onset   Coronary artery disease Maternal Grandmother    Coronary artery disease Maternal Grandfather    Prostate cancer Neg Hx    Bladder Cancer Neg Hx    Kidney cancer Neg Hx     Social History:  reports that he has quit smoking. His smoking use included cigarettes. He has never used smokeless tobacco. He reports current alcohol use  of about 1.0 standard drink of alcohol per week. He reports that he does not use drugs.   Physical Exam: BP (!) 144/77   Pulse 75   Ht 5\' 11"  (1.803 m)   Wt 195 lb (88.5 kg)   BMI 27.20 kg/m   Constitutional:  Alert and oriented, No acute distress. HEENT: [place] AT Respiratory: Normal respiratory effort, no increased work of breathing. GI: Abdomen is soft, nontender, nondistended, no abdominal masses Psychiatric: Normal mood and affect.   Assessment & Plan:    1. Elevated PSA Although PSA is a prostate cancer screening test he was informed that cancer is not the most common cause of an elevated PSA. Other potential causes including BPH and inflammation were discussed.  He was informed that the only way to adequately diagnose prostate cancer would be transrectal  ultrasound and biopsy of the prostate. The procedure was discussed including potential risks of bleeding and infection/sepsis. He was also informed that a negative biopsy does not conclusively rule out the possibility that prostate cancer may be present and that continued monitoring is required.  The use of newer adjunctive blood and urine tests to predict the probability of high grade prostate cancer were discussed.  The use of multiparametric prostate MRI to evaluate for lesions suspicious for high grade prostate cancer and aid in targeted bx was reviewed.  Continued periodic surveillance was also discussed.  He has elected surveillance for now and will repeat a PSA in late May 2025.  I have reviewed the above documentation for accuracy and completeness, and I agree with the above.   Eduardo Altes, MD  [facility] [address], [address] [place], [place] [address] [phone]

## 2023-07-01 DIAGNOSIS — M545 Low back pain, unspecified: Secondary | ICD-10-CM | POA: Diagnosis not present

## 2023-07-01 DIAGNOSIS — M5431 Sciatica, right side: Secondary | ICD-10-CM | POA: Diagnosis not present

## 2023-07-01 DIAGNOSIS — M9905 Segmental and somatic dysfunction of pelvic region: Secondary | ICD-10-CM | POA: Diagnosis not present

## 2023-07-01 DIAGNOSIS — M9903 Segmental and somatic dysfunction of lumbar region: Secondary | ICD-10-CM | POA: Diagnosis not present

## 2023-07-16 DIAGNOSIS — M5431 Sciatica, right side: Secondary | ICD-10-CM | POA: Diagnosis not present

## 2023-07-16 DIAGNOSIS — M545 Low back pain, unspecified: Secondary | ICD-10-CM | POA: Diagnosis not present

## 2023-07-16 DIAGNOSIS — M9903 Segmental and somatic dysfunction of lumbar region: Secondary | ICD-10-CM | POA: Diagnosis not present

## 2023-07-16 DIAGNOSIS — M9905 Segmental and somatic dysfunction of pelvic region: Secondary | ICD-10-CM | POA: Diagnosis not present

## 2023-08-04 DIAGNOSIS — Z0000 Encounter for general adult medical examination without abnormal findings: Secondary | ICD-10-CM | POA: Diagnosis not present

## 2023-08-04 DIAGNOSIS — E782 Mixed hyperlipidemia: Secondary | ICD-10-CM | POA: Diagnosis not present

## 2023-08-06 DIAGNOSIS — Z0000 Encounter for general adult medical examination without abnormal findings: Secondary | ICD-10-CM | POA: Diagnosis not present

## 2023-08-06 DIAGNOSIS — Z79899 Other long term (current) drug therapy: Secondary | ICD-10-CM | POA: Diagnosis not present

## 2023-08-06 DIAGNOSIS — E782 Mixed hyperlipidemia: Secondary | ICD-10-CM | POA: Diagnosis not present

## 2023-08-06 DIAGNOSIS — M1A00X0 Idiopathic chronic gout, unspecified site, without tophus (tophi): Secondary | ICD-10-CM | POA: Diagnosis not present

## 2023-08-06 DIAGNOSIS — I10 Essential (primary) hypertension: Secondary | ICD-10-CM | POA: Diagnosis not present

## 2023-08-27 DIAGNOSIS — M9903 Segmental and somatic dysfunction of lumbar region: Secondary | ICD-10-CM | POA: Diagnosis not present

## 2023-08-27 DIAGNOSIS — M9905 Segmental and somatic dysfunction of pelvic region: Secondary | ICD-10-CM | POA: Diagnosis not present

## 2023-08-27 DIAGNOSIS — M5431 Sciatica, right side: Secondary | ICD-10-CM | POA: Diagnosis not present

## 2023-08-27 DIAGNOSIS — M545 Low back pain, unspecified: Secondary | ICD-10-CM | POA: Diagnosis not present

## 2023-09-09 DIAGNOSIS — M9905 Segmental and somatic dysfunction of pelvic region: Secondary | ICD-10-CM | POA: Diagnosis not present

## 2023-09-09 DIAGNOSIS — M9903 Segmental and somatic dysfunction of lumbar region: Secondary | ICD-10-CM | POA: Diagnosis not present

## 2023-09-09 DIAGNOSIS — M545 Low back pain, unspecified: Secondary | ICD-10-CM | POA: Diagnosis not present

## 2023-09-09 DIAGNOSIS — M5431 Sciatica, right side: Secondary | ICD-10-CM | POA: Diagnosis not present

## 2023-09-24 DIAGNOSIS — M9903 Segmental and somatic dysfunction of lumbar region: Secondary | ICD-10-CM | POA: Diagnosis not present

## 2023-09-24 DIAGNOSIS — M9905 Segmental and somatic dysfunction of pelvic region: Secondary | ICD-10-CM | POA: Diagnosis not present

## 2023-09-24 DIAGNOSIS — M545 Low back pain, unspecified: Secondary | ICD-10-CM | POA: Diagnosis not present

## 2023-09-24 DIAGNOSIS — M5431 Sciatica, right side: Secondary | ICD-10-CM | POA: Diagnosis not present

## 2023-10-22 DIAGNOSIS — Z6827 Body mass index (BMI) 27.0-27.9, adult: Secondary | ICD-10-CM | POA: Diagnosis not present

## 2023-10-22 DIAGNOSIS — M9903 Segmental and somatic dysfunction of lumbar region: Secondary | ICD-10-CM | POA: Diagnosis not present

## 2023-10-22 DIAGNOSIS — I471 Supraventricular tachycardia, unspecified: Secondary | ICD-10-CM | POA: Diagnosis not present

## 2023-10-22 DIAGNOSIS — M9905 Segmental and somatic dysfunction of pelvic region: Secondary | ICD-10-CM | POA: Diagnosis not present

## 2023-10-22 DIAGNOSIS — M5431 Sciatica, right side: Secondary | ICD-10-CM | POA: Diagnosis not present

## 2023-10-22 DIAGNOSIS — E663 Overweight: Secondary | ICD-10-CM | POA: Diagnosis not present

## 2023-10-22 DIAGNOSIS — M545 Low back pain, unspecified: Secondary | ICD-10-CM | POA: Diagnosis not present

## 2023-10-22 DIAGNOSIS — Z008 Encounter for other general examination: Secondary | ICD-10-CM | POA: Diagnosis not present

## 2023-10-22 DIAGNOSIS — I10 Essential (primary) hypertension: Secondary | ICD-10-CM | POA: Diagnosis not present

## 2023-10-22 DIAGNOSIS — E785 Hyperlipidemia, unspecified: Secondary | ICD-10-CM | POA: Diagnosis not present

## 2023-10-24 ENCOUNTER — Other Ambulatory Visit: Payer: Self-pay

## 2023-10-24 DIAGNOSIS — R972 Elevated prostate specific antigen [PSA]: Secondary | ICD-10-CM

## 2023-10-25 LAB — PSA: Prostate Specific Ag, Serum: 13.9 ng/mL — ABNORMAL HIGH (ref 0.0–4.0)

## 2023-10-26 ENCOUNTER — Ambulatory Visit: Payer: Self-pay | Admitting: Urology

## 2023-10-28 ENCOUNTER — Other Ambulatory Visit: Payer: Self-pay | Admitting: *Deleted

## 2023-10-28 DIAGNOSIS — R972 Elevated prostate specific antigen [PSA]: Secondary | ICD-10-CM

## 2023-11-04 ENCOUNTER — Ambulatory Visit
Admission: RE | Admit: 2023-11-04 | Discharge: 2023-11-04 | Disposition: A | Discharge status: Home / Self Care | Source: Ambulatory Visit | Attending: Urology | Admitting: Urology

## 2023-11-04 DIAGNOSIS — K5730 Diverticulosis of large intestine without perforation or abscess without bleeding: Secondary | ICD-10-CM | POA: Diagnosis not present

## 2023-11-04 DIAGNOSIS — R972 Elevated prostate specific antigen [PSA]: Secondary | ICD-10-CM | POA: Insufficient documentation

## 2023-11-04 DIAGNOSIS — N4289 Other specified disorders of prostate: Secondary | ICD-10-CM | POA: Diagnosis not present

## 2023-11-04 MED ORDER — GADOBUTROL 1 MMOL/ML IV SOLN
7.5000 mL | Freq: Once | INTRAVENOUS | Status: AC | PRN
  Administered 2023-11-04: 7.5 mL via INTRAVENOUS

## 2023-11-11 ENCOUNTER — Ambulatory Visit: Payer: Self-pay | Admitting: Urology

## 2023-11-19 DIAGNOSIS — M9903 Segmental and somatic dysfunction of lumbar region: Secondary | ICD-10-CM | POA: Diagnosis not present

## 2023-11-19 DIAGNOSIS — M545 Low back pain, unspecified: Secondary | ICD-10-CM | POA: Diagnosis not present

## 2023-11-19 DIAGNOSIS — M9905 Segmental and somatic dysfunction of pelvic region: Secondary | ICD-10-CM | POA: Diagnosis not present

## 2023-11-19 DIAGNOSIS — M5431 Sciatica, right side: Secondary | ICD-10-CM | POA: Diagnosis not present

## 2023-12-16 ENCOUNTER — Other Ambulatory Visit: Payer: Self-pay | Admitting: Urology

## 2023-12-16 MED ORDER — DIAZEPAM 5 MG PO TABS: ORAL_TABLET | ORAL | 0 refills | Status: DC

## 2023-12-16 NOTE — Telephone Encounter (Signed)
 Notified patient as instructed, patient pleased

## 2023-12-17 ENCOUNTER — Ambulatory Visit: Admitting: Urology

## 2023-12-17 VITALS — BP 160/85 | HR 83 | Ht 71.0 in | Wt 195.0 lb

## 2023-12-17 DIAGNOSIS — Z2989 Encounter for other specified prophylactic measures: Secondary | ICD-10-CM | POA: Diagnosis not present

## 2023-12-17 DIAGNOSIS — N4231 Prostatic intraepithelial neoplasia: Secondary | ICD-10-CM | POA: Diagnosis not present

## 2023-12-17 DIAGNOSIS — C61 Malignant neoplasm of prostate: Secondary | ICD-10-CM

## 2023-12-17 DIAGNOSIS — R972 Elevated prostate specific antigen [PSA]: Secondary | ICD-10-CM | POA: Diagnosis not present

## 2023-12-17 DIAGNOSIS — N4232 Atypical small acinar proliferation of prostate: Secondary | ICD-10-CM

## 2023-12-17 MED ORDER — GENTAMICIN SULFATE 40 MG/ML IJ SOLN
80.0000 mg | Freq: Once | INTRAMUSCULAR | Status: AC
  Administered 2023-12-17: 80 mg via INTRAMUSCULAR

## 2023-12-17 MED ORDER — LEVOFLOXACIN 500 MG PO TABS
500.0000 mg | ORAL_TABLET | Freq: Once | ORAL | Status: AC
  Administered 2023-12-17: 500 mg via ORAL

## 2023-12-17 NOTE — Patient Instructions (Addendum)

## 2023-12-17 NOTE — Progress Notes (Signed)
   12/17/23  Indication: Elevated PSA, 13.9, abnormal prostate MRI  MRI Fusion Prostate Biopsy Procedure   Informed consent was obtained, and we discussed the risks of bleeding and infection/sepsis. A time out was performed to ensure correct patient identity.  Pre-Procedure: - Last PSA Level: 13.9 - Gentamicin  and levaquin  given for antibiotic prophylaxis -Prostate measured 71 g on MRI, PSA density 0.20 - ROI hypoechoic  Procedure: - Prostate block performed using 10 cc 1% lidocaine   - MRI fusion biopsy was performed, and 4 biopsies were taken from the ROI#1 PIRADS 5 lesion - Standard biopsies taken from sextant areas, 12 under ultrasound guidance. - Total of 16 cores taken  Post-Procedure: - Patient tolerated the procedure well - He was counseled to seek immediate medical attention if experiences significant bleeding, fevers, or severe pain - Return in one week to discuss biopsy results  Assessment/ Plan: Will follow up in 1-2 weeks to discuss pathology with Dr. Twylla Redell Burnet, MD 12/17/2023

## 2023-12-22 DIAGNOSIS — M545 Low back pain, unspecified: Secondary | ICD-10-CM | POA: Diagnosis not present

## 2023-12-22 DIAGNOSIS — M9903 Segmental and somatic dysfunction of lumbar region: Secondary | ICD-10-CM | POA: Diagnosis not present

## 2023-12-22 DIAGNOSIS — M5431 Sciatica, right side: Secondary | ICD-10-CM | POA: Diagnosis not present

## 2023-12-22 DIAGNOSIS — M9905 Segmental and somatic dysfunction of pelvic region: Secondary | ICD-10-CM | POA: Diagnosis not present

## 2023-12-24 ENCOUNTER — Ambulatory Visit (INDEPENDENT_AMBULATORY_CARE_PROVIDER_SITE_OTHER): Admitting: Urology

## 2023-12-24 ENCOUNTER — Encounter: Payer: Self-pay | Admitting: Urology

## 2023-12-24 VITALS — BP 162/81 | HR 66 | Ht 71.0 in | Wt 195.0 lb

## 2023-12-24 DIAGNOSIS — C61 Malignant neoplasm of prostate: Secondary | ICD-10-CM

## 2023-12-24 NOTE — Progress Notes (Signed)
 12/24/2023 12:17 PM   Eduardo Perez [birth year] [id number]  Referring provider: Auston Reyes BIRCH, MD [address] [facility] [place],  [place] [address]  Chief Complaint  Patient presents with   Results    HPI: Eduardo Perez is a 68 y.o. male who presents for prostate biopsy follow-up.  He presents today with his spouse.  MR fusion biopsy performed 12/17/2023; PSA 13.9; prostate MRI 71 cc volume with PI-RADS 5 lesion left anterior mid gland and apex.   States he has mild diarrhea since the biopsy Pathology: 12/12 template cores negative for cancer with 3 core showing PIN and 1 showing a focus of atypical small acinar proliferation 4 ROI cores taken with Gleason 4+4 adenocarcinoma involving 20% of the submitted tissue    PMH: Past Medical History:  Diagnosis Date   Adiposity 12/06/2014   Asthma    childhood   Avitaminosis D 12/06/2014   COVID-19    about a year ago   Essential (primary) hypertension 12/06/2014   Intolerant of Losartan-palpitation.    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 No NSAIDs.    Tachycardia     Surgical History: Past Surgical History:  Procedure Laterality Date   COLONOSCOPY WITH PROPOFOL  N/A 11/11/2019   Procedure: COLONOSCOPY WITH PROPOFOL ;  Surgeon: Jinny Carmine, MD;  Location: [facility];  Service: Endoscopy;  Laterality: N/A;  Priority 4   Hx of transfusion of packed red blood cells  07/2008   INSERTION OF MESH Bilateral 03/26/2023   Procedure: INSERTION OF MESH;  Surgeon: Rodolph Romano, MD;  Location: [facility];  Service: General;  Laterality: Bilateral;    Home Medications:  Allergies as of 12/24/2023   No Known Allergies      Medication List        Accurate as of December 24, 2023 12:17 PM. If you have any questions, ask your nurse or doctor.          STOP taking these medications    diazepam  5 MG tablet Commonly known as:  VALIUM    MENATETRENONE PO   tadalafil 5 MG tablet Commonly known as: CIALIS       TAKE these medications    carvedilol  6.25 MG tablet Commonly known as: COREG  Take 1 tablet (6.25 mg total) by mouth 2 (two) times daily with a meal.   cyanocobalamin 1000 MCG tablet Commonly known as: VITAMIN B12 Take 1,000 mcg by mouth.   FISH OIL PO Take 1 capsule by mouth daily.   multivitamin tablet Take 1 tablet by mouth daily. Fish oil        Allergies: No Known Allergies  Family History: Family History  Problem Relation Age of Onset   Coronary artery disease Maternal Grandmother    Coronary artery disease Maternal Grandfather    Prostate cancer Neg Hx    Bladder Cancer Neg Hx    Kidney cancer Neg Hx     Social History:  reports that he has quit smoking. His smoking use included cigarettes. He has never used smokeless tobacco. He reports current alcohol use of about 1.0 standard drink of alcohol per week. He reports that he does not use drugs.   Physical Exam: BP (!) 162/81   Pulse 66   Ht 5' 11 (1.803 m)   Wt 195 lb (88.5 kg)   BMI 27.20 kg/m   Constitutional:  Alert and oriented, No acute distress.  HEENT: [place] AT Respiratory: Normal respiratory effort, no increased work of breathing. Psychiatric: Normal mood and affect.    Assessment & Plan:    1.  Prostate cancer cT1c Nx Mx NCCN risk stratification: High Recommend further staging with PSMA/PET If no evidence of metastatic disease we briefly discussed curative treatment options of radical prostatectomy and radiation modalities including EBRT + ADT and EBRT + brachytherapy + ADT   Glendia JAYSON Barba, MD  [facility] [address] [place], [place] [address] [phone]

## 2023-12-24 NOTE — Patient Instructions (Signed)
Scheduling [phone] 

## 2023-12-26 ENCOUNTER — Telehealth: Payer: Self-pay

## 2023-12-26 NOTE — Telephone Encounter (Signed)
 Patient called for request info about getting approved for his PET scan. Can be called back at [phone]

## 2023-12-31 ENCOUNTER — Ambulatory Visit: Admitting: Urology

## 2024-01-05 ENCOUNTER — Ambulatory Visit
Admission: RE | Admit: 2024-01-05 | Discharge: 2024-01-05 | Disposition: A | Discharge status: Home / Self Care | Source: Ambulatory Visit | Attending: Urology | Admitting: Urology

## 2024-01-05 DIAGNOSIS — C61 Malignant neoplasm of prostate: Secondary | ICD-10-CM | POA: Insufficient documentation

## 2024-01-05 MED ORDER — FLOTUFOLASTAT F 18 GALLIUM 296-5846 MBQ/ML IV SOLN
8.5600 | Freq: Once | INTRAVENOUS | Status: AC
  Administered 2024-01-05: 8.56 via INTRAVENOUS
  Filled 2024-01-05: qty 9

## 2024-01-07 ENCOUNTER — Encounter: Payer: Self-pay | Admitting: Urology

## 2024-01-07 DIAGNOSIS — M9905 Segmental and somatic dysfunction of pelvic region: Secondary | ICD-10-CM | POA: Diagnosis not present

## 2024-01-07 DIAGNOSIS — M9903 Segmental and somatic dysfunction of lumbar region: Secondary | ICD-10-CM | POA: Diagnosis not present

## 2024-01-07 DIAGNOSIS — M5431 Sciatica, right side: Secondary | ICD-10-CM | POA: Diagnosis not present

## 2024-01-07 DIAGNOSIS — M545 Low back pain, unspecified: Secondary | ICD-10-CM | POA: Diagnosis not present

## 2024-01-18 ENCOUNTER — Ambulatory Visit: Payer: Self-pay | Admitting: Urology

## 2024-01-19 DIAGNOSIS — M9905 Segmental and somatic dysfunction of pelvic region: Secondary | ICD-10-CM | POA: Diagnosis not present

## 2024-01-19 DIAGNOSIS — M5431 Sciatica, right side: Secondary | ICD-10-CM | POA: Diagnosis not present

## 2024-01-19 DIAGNOSIS — M9903 Segmental and somatic dysfunction of lumbar region: Secondary | ICD-10-CM | POA: Diagnosis not present

## 2024-01-19 DIAGNOSIS — M545 Low back pain, unspecified: Secondary | ICD-10-CM | POA: Diagnosis not present

## 2024-02-11 DIAGNOSIS — Z79899 Other long term (current) drug therapy: Secondary | ICD-10-CM | POA: Diagnosis not present

## 2024-02-11 DIAGNOSIS — I10 Essential (primary) hypertension: Secondary | ICD-10-CM | POA: Diagnosis not present

## 2024-02-11 DIAGNOSIS — E782 Mixed hyperlipidemia: Secondary | ICD-10-CM | POA: Diagnosis not present

## 2024-02-16 DIAGNOSIS — Z0000 Encounter for general adult medical examination without abnormal findings: Secondary | ICD-10-CM | POA: Diagnosis not present

## 2024-02-16 DIAGNOSIS — M1A00X0 Idiopathic chronic gout, unspecified site, without tophus (tophi): Secondary | ICD-10-CM | POA: Diagnosis not present

## 2024-02-16 DIAGNOSIS — Z125 Encounter for screening for malignant neoplasm of prostate: Secondary | ICD-10-CM | POA: Diagnosis not present

## 2024-02-16 DIAGNOSIS — I10 Essential (primary) hypertension: Secondary | ICD-10-CM | POA: Diagnosis not present

## 2024-02-16 DIAGNOSIS — E782 Mixed hyperlipidemia: Secondary | ICD-10-CM | POA: Diagnosis not present

## 2024-02-18 ENCOUNTER — Other Ambulatory Visit: Payer: Self-pay | Admitting: Urology

## 2024-02-18 DIAGNOSIS — C61 Malignant neoplasm of prostate: Secondary | ICD-10-CM

## 2024-02-23 DIAGNOSIS — M9905 Segmental and somatic dysfunction of pelvic region: Secondary | ICD-10-CM | POA: Diagnosis not present

## 2024-02-23 DIAGNOSIS — M9903 Segmental and somatic dysfunction of lumbar region: Secondary | ICD-10-CM | POA: Diagnosis not present

## 2024-02-23 DIAGNOSIS — M5431 Sciatica, right side: Secondary | ICD-10-CM | POA: Diagnosis not present

## 2024-02-23 DIAGNOSIS — M545 Low back pain, unspecified: Secondary | ICD-10-CM | POA: Diagnosis not present

## 2024-02-24 ENCOUNTER — Telehealth: Admitting: Urology

## 2024-02-24 NOTE — Telephone Encounter (Signed)
 Talked with patient about his ref. He is going to call [facility] and ask them about his insurances. Give him the number to call [phone]

## 2024-02-24 NOTE — Telephone Encounter (Signed)
 [facility] called regarding a referral that was sent to them for patient. They stated that patient's insurance is OON with [facility] and patient will need to be referred elsewhere. Please advise

## 2024-02-25 DIAGNOSIS — M9905 Segmental and somatic dysfunction of pelvic region: Secondary | ICD-10-CM | POA: Diagnosis not present

## 2024-02-25 DIAGNOSIS — M5431 Sciatica, right side: Secondary | ICD-10-CM | POA: Diagnosis not present

## 2024-02-25 DIAGNOSIS — M545 Low back pain, unspecified: Secondary | ICD-10-CM | POA: Diagnosis not present

## 2024-02-25 DIAGNOSIS — M9903 Segmental and somatic dysfunction of lumbar region: Secondary | ICD-10-CM | POA: Diagnosis not present

## 2024-03-22 DIAGNOSIS — M545 Low back pain, unspecified: Secondary | ICD-10-CM | POA: Diagnosis not present

## 2024-03-22 DIAGNOSIS — M5431 Sciatica, right side: Secondary | ICD-10-CM | POA: Diagnosis not present

## 2024-03-22 DIAGNOSIS — M9905 Segmental and somatic dysfunction of pelvic region: Secondary | ICD-10-CM | POA: Diagnosis not present

## 2024-03-22 DIAGNOSIS — M9903 Segmental and somatic dysfunction of lumbar region: Secondary | ICD-10-CM | POA: Diagnosis not present
